# Patient Record
Sex: Female | Born: 1975 | Race: Black or African American | Hispanic: No | Marital: Single | Smoking: Never smoker
Health system: Southern US, Community
[De-identification: ages and names within clinical notes are randomized; demographics above are authoritative.]

## PROBLEM LIST (undated history)

## (undated) DIAGNOSIS — R519 Headache, unspecified: Secondary | ICD-10-CM

## (undated) DIAGNOSIS — K219 Gastro-esophageal reflux disease without esophagitis: Secondary | ICD-10-CM

## (undated) HISTORY — DX: Gastro-esophageal reflux disease without esophagitis: K21.9

## (undated) HISTORY — DX: Headache, unspecified: R51.9

---

## 2019-10-21 ENCOUNTER — Ambulatory Visit: Payer: Self-pay | Attending: Internal Medicine

## 2019-10-21 DIAGNOSIS — Z23 Encounter for immunization: Secondary | ICD-10-CM

## 2019-10-21 NOTE — Progress Notes (Signed)
   Covid-19 Vaccination Clinic  Name:  DREW HERMAN    MRN: [id number] DOB: Mar 08, 1976  10/21/2019  Ms. Risser was observed post Covid-19 immunization for 15 minutes without incident. She was provided with Vaccine Information Sheet and instruction to access the V-Safe system.   Ms. Torrance was instructed to call 911 with any severe reactions post vaccine: Marland Kitchen Difficulty breathing  . Swelling of face and throat  . A fast heartbeat  . A bad rash all over body  . Dizziness and weakness   Immunizations Administered    Name Date Dose VIS Date Route   Pfizer COVID-19 Vaccine 10/21/2019  9:31 AM 0.3 mL 07/23/2019 Intramuscular   Manufacturer: [employer], [employer]   Lot: [id number]   NDC: [address]-7

## 2019-11-15 ENCOUNTER — Ambulatory Visit: Payer: Self-pay | Attending: Internal Medicine

## 2019-11-15 DIAGNOSIS — Z23 Encounter for immunization: Secondary | ICD-10-CM

## 2019-11-15 NOTE — Progress Notes (Signed)
   Covid-19 Vaccination Clinic  Name:  JESIKA MEN    MRN: [id number] DOB: February 14, 1976  11/15/2019  Ms. Wroe was observed post Covid-19 immunization for 15 minutes without incident. She was provided with Vaccine Information Sheet and instruction to access the V-Safe system.   Ms. Clowers was instructed to call 911 with any severe reactions post vaccine: Marland Kitchen Difficulty breathing  . Swelling of face and throat  . A fast heartbeat  . A bad rash all over body  . Dizziness and weakness   Immunizations Administered    Name Date Dose VIS Date Route   Pfizer COVID-19 Vaccine 11/15/2019 12:34 PM 0.3 mL 07/23/2019 Intramuscular   Manufacturer: [employer], [employer]   Lot: [id number]   NDC: [address]-0

## 2020-03-14 ENCOUNTER — Encounter: Payer: Self-pay | Admitting: Neurology

## 2020-03-15 ENCOUNTER — Other Ambulatory Visit: Payer: Self-pay | Admitting: Family Medicine

## 2020-03-15 DIAGNOSIS — N63 Unspecified lump in unspecified breast: Secondary | ICD-10-CM

## 2020-04-05 ENCOUNTER — Other Ambulatory Visit: Payer: Self-pay | Admitting: Family Medicine

## 2020-04-05 ENCOUNTER — Ambulatory Visit
Admission: RE | Admit: 2020-04-05 | Discharge: 2020-04-05 | Disposition: A | Discharge status: Home / Self Care | Payer: 59 | Source: Ambulatory Visit | Attending: Family Medicine | Admitting: Family Medicine

## 2020-04-05 ENCOUNTER — Other Ambulatory Visit: Payer: Self-pay

## 2020-04-05 DIAGNOSIS — N6310 Unspecified lump in the right breast, unspecified quadrant: Secondary | ICD-10-CM

## 2020-04-05 DIAGNOSIS — Z1231 Encounter for screening mammogram for malignant neoplasm of breast: Secondary | ICD-10-CM

## 2020-04-05 DIAGNOSIS — N63 Unspecified lump in unspecified breast: Secondary | ICD-10-CM

## 2020-04-05 IMAGING — US US BREAST*R* LIMITED INC AXILLA
1 series · 6 of 6 positions shown · non-contrast
Comparison: Previous exam(s).

CLINICAL DATA: Patient for short-term follow-up probably benign
right breast mass.

EXAM:
ULTRASOUND OF THE RIGHT BREAST

[Series 1: us breast*right* limited inc axilla · 0.06mm/px · 6 of 6 slices shown]
[im 1/6]
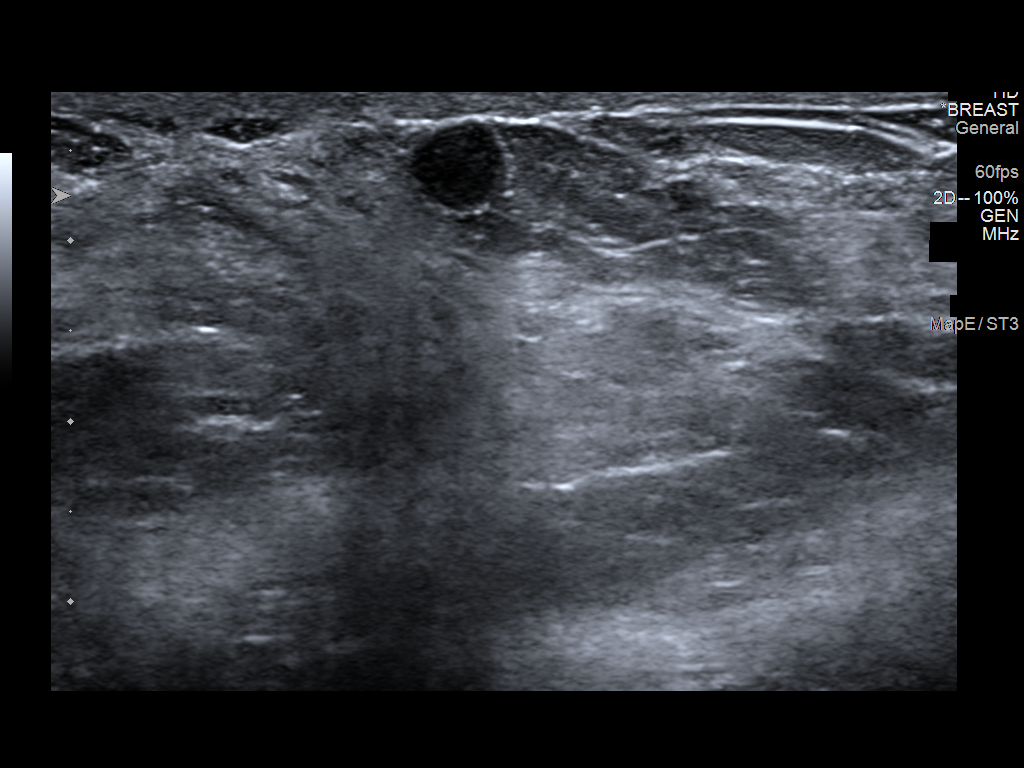
[im 2/6]
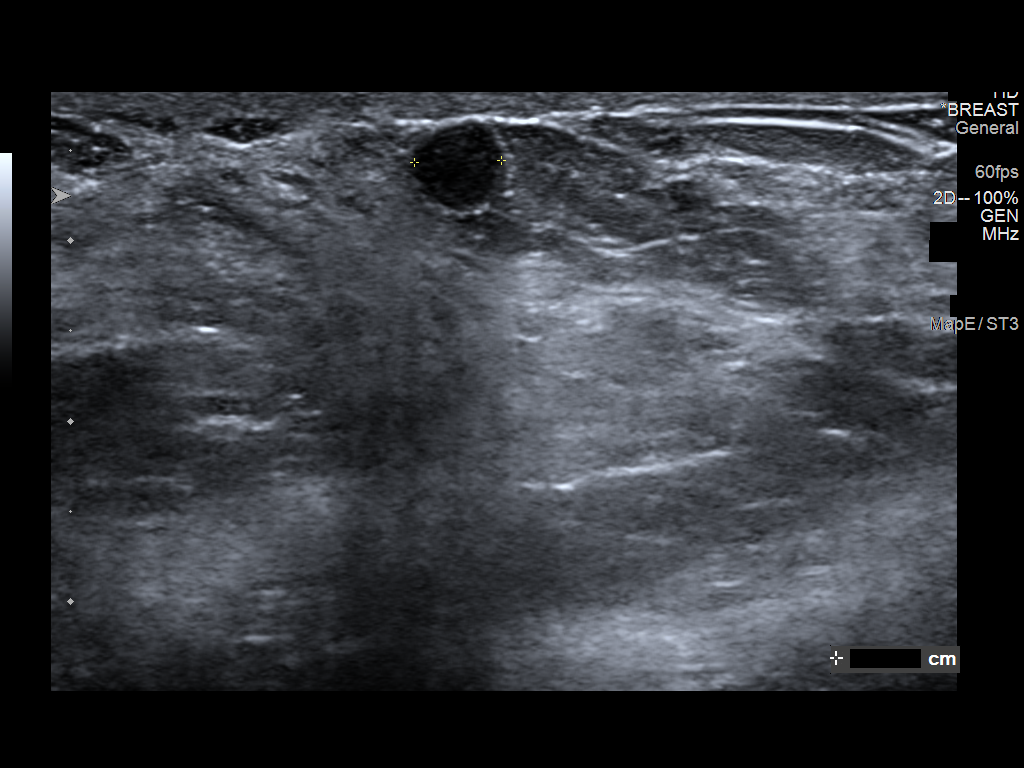
[im 3/6]
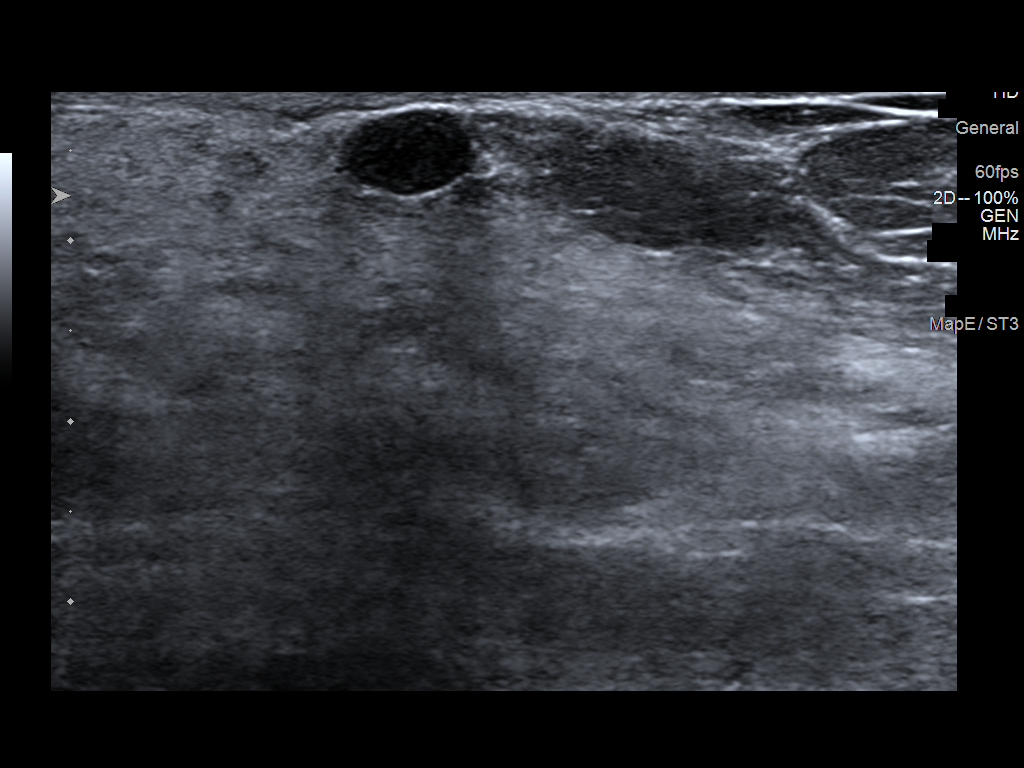
[im 4/6]
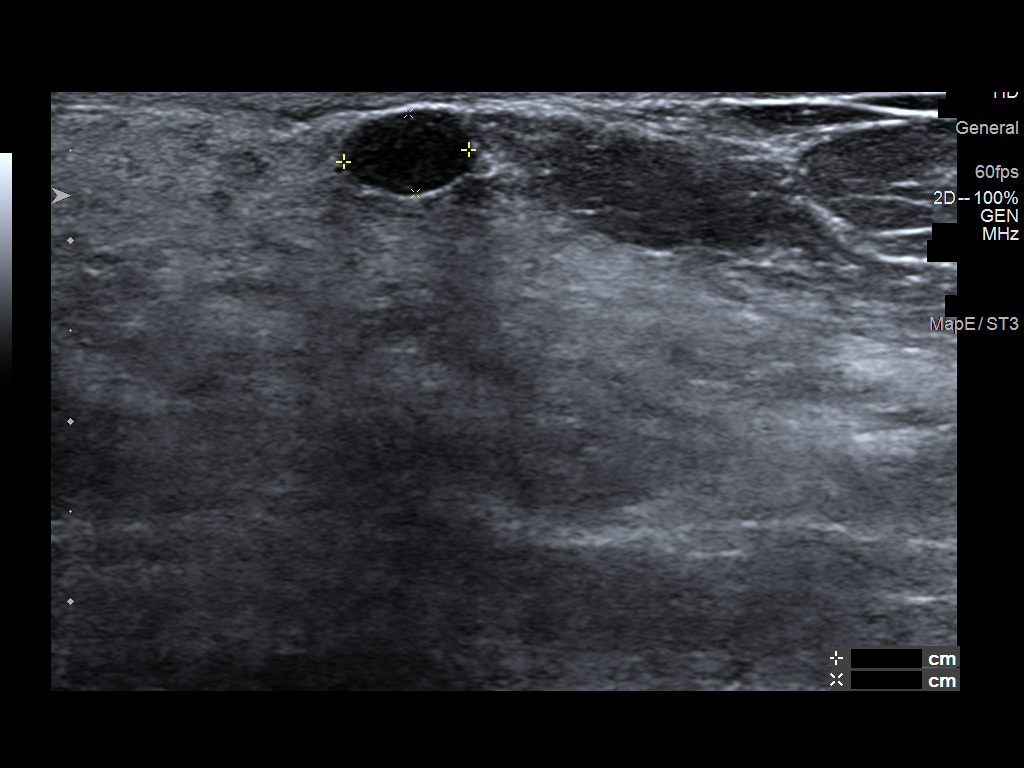
[im 5/6]
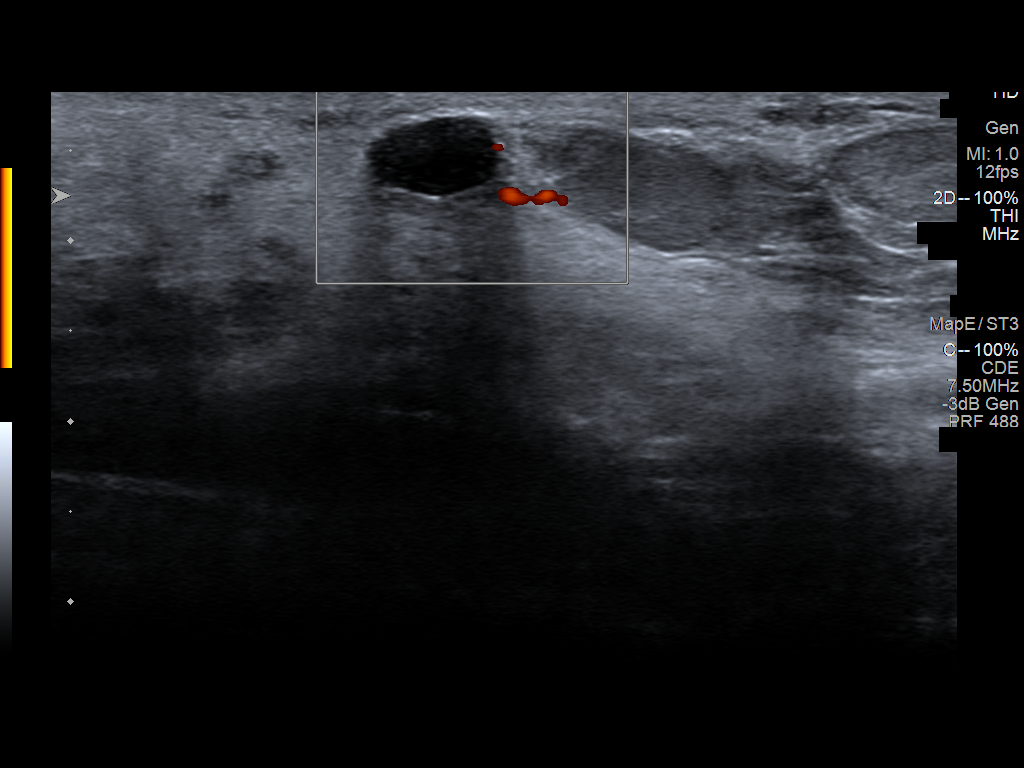
[im 6/6]
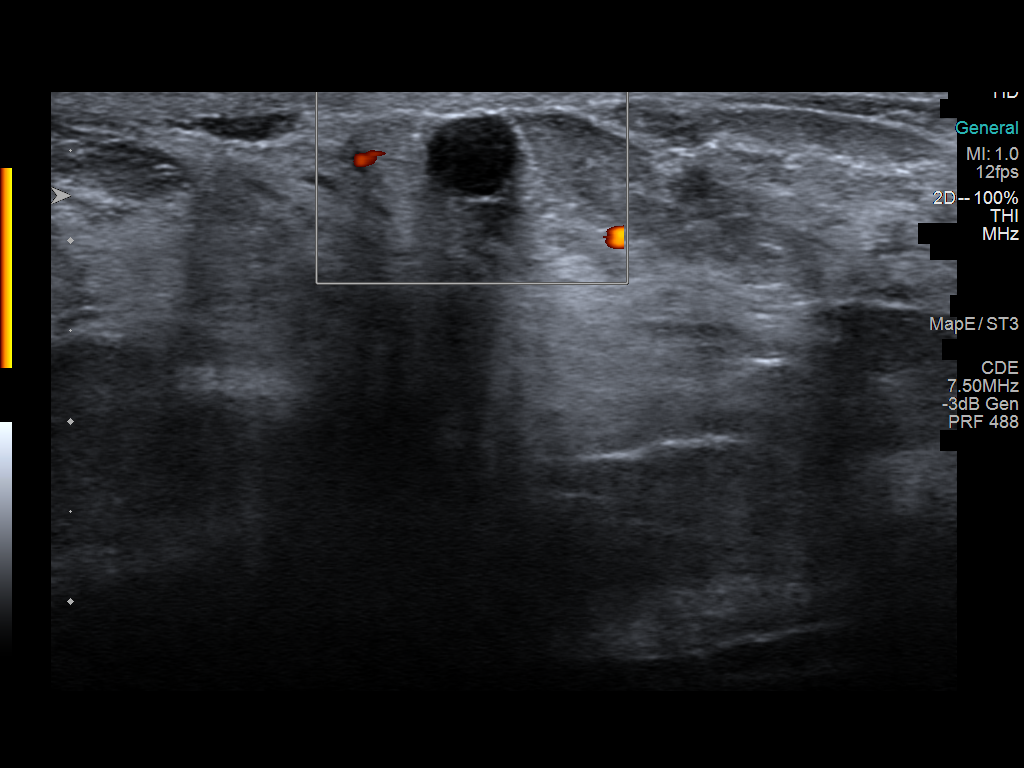

[6 of 6 positions shown; findings below may reference images not displayed]

FINDINGS: Targeted ultrasound is performed, showing a stable 5 x 7 x 4 mm oval
circumscribed hypoechoic mass right breast 6 o'clock position
periareolar location, previously 7 x 5 x 5 mm.
IMPRESSION: Stable probably benign right breast mass.

RECOMMENDATION:
Bilateral diagnostic mammography and right breast ultrasound

I have discussed the findings and recommendations with the patient.
If applicable, a reminder letter will be sent to the patient
regarding the next appointment.

BI-RADS CATEGORY  3: Probably benign.

## 2020-04-24 ENCOUNTER — Other Ambulatory Visit: Payer: Self-pay

## 2020-04-24 ENCOUNTER — Encounter: Payer: Self-pay | Admitting: Neurology

## 2020-04-24 ENCOUNTER — Other Ambulatory Visit: Payer: Self-pay | Admitting: Neurology

## 2020-04-24 ENCOUNTER — Telehealth: Payer: Self-pay | Admitting: Neurology

## 2020-04-24 ENCOUNTER — Ambulatory Visit (INDEPENDENT_AMBULATORY_CARE_PROVIDER_SITE_OTHER): Payer: 59 | Admitting: Neurology

## 2020-04-24 VITALS — BP 134/85 | HR 110 | Ht 65.0 in | Wt 204.4 lb

## 2020-04-24 DIAGNOSIS — G43809 Other migraine, not intractable, without status migrainosus: Secondary | ICD-10-CM

## 2020-04-24 DIAGNOSIS — G43719 Chronic migraine without aura, intractable, without status migrainosus: Secondary | ICD-10-CM

## 2020-04-24 MED ORDER — BACLOFEN 10 MG PO TABS: ORAL_TABLET | ORAL | 5 refills | Status: DC

## 2020-04-24 MED ORDER — VENLAFAXINE HCL ER 37.5 MG PO CP24: ORAL_CAPSULE | ORAL | 0 refills | Status: DC

## 2020-04-24 MED ORDER — PROMETHAZINE HCL 12.5 MG PO TABS: 12.5000 mg | ORAL_TABLET | Freq: Four times a day (QID) | ORAL | 5 refills | Status: DC | PRN

## 2020-04-24 NOTE — Telephone Encounter (Signed)
Prescription sent

## 2020-04-24 NOTE — Progress Notes (Signed)
NEUROLOGY CONSULTATION NOTE  Dawn Hicks MRN: [id number] DOB: [birth year]  Referring provider: Deatra James, MD Primary care provider: Deatra James, MD  Reason for consult:  migraines  HISTORY OF PRESENT ILLNESS: Dawn Hicks is a 44 year old right-handed female who presents for migraines.  History supplemented by referring provider's note.  Onset:  44 years old Location:  Sometimes back of neck, sometimes behind left ear, right temple, sometimes frontal.  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 Quality:  Back of neck is stabbing, throbbing on head Intensity:  severe   Aura:  no Prodrome:  no Postdrome:  no Associated symptoms:  Nausea, vertigo, photophobia, phonophobia.  She denies associated unilateral numbness or weakness. Duration:  1 day to several days Frequency:  Over 15 days a month (5 a month when taking topiramate twice a day) Triggers:  Hunger, certain smell Relieving factors:  Rubbing temples Activity:  aggravates  Current NSAIDS:  none Current analgesics:  none Current triptans:  Sumatriptan 100mg  Current ergotamine:  none Current anti-emetic:  Promethazine 12.5mg  Current muscle relaxants:  none Current anti-anxiolytic:  none Current sleep aide:  none Current Antihypertensive medications:  none Current Antidepressant medications:  none Current Anticonvulsant medications:  topiramate 100mg  once a day but occasionally twice a day (effective but causes brain fog) Current anti-CGRP:  none Current Vitamins/Herbal/Supplements:  B12, zinc, C, D, probiotic, chlorophyl Current Antihistamines/Decongestants:  none Other therapy:  none Hormone/birth control:  none  Past NSAIDS:  Ibuprofen, naproxen Past analgesics:  Excedrin, acetaminophen Past abortive triptans:  none Past abortive ergotamine:  none Past muscle relaxants:  baclofen Past anti-emetic:  promethazine Past antihypertensive medications:  none Past antidepressant medications:  escitalopram Past  anticonvulsant medications:  none Past anti-CGRP:  none Past vitamins/Herbal/Supplements:  none Past antihistamines/decongestants:  none Other past therapies:  Occipital nerve blocks  Caffeine:  Coffee less than once a week.  Sweet tea Diet:  Watches what she eats.  Ginger ale.  Increasing water intake Exercise:  Walks daily.  Cardio 3 times a week. Depression:  yes; Anxiety:  yes Other pain:  none Sleep hygiene:  Trouble staying asleep.  Wakes up at 4 AM Family history of headache:  5 aunts, 3 to 4 cousins  03/14/2020 LABS:  CBC with WBC 4.6, HGB 14.1, HCT 41.5, PLT 272   PAST MEDICAL HISTORY: Past Medical History:  Diagnosis Date  . Headache     PAST SURGICAL HISTORY: History reviewed. No pertinent surgical history.  MEDICATIONS: Outpatient Encounter Medications as of 04/24/2020  Medication Sig  . promethazine (PHENERGAN) 12.5 MG tablet Take 12.5 mg by mouth every 6 (six) hours as needed for nausea or vomiting.  . SUMAtriptan (IMITREX) 100 MG tablet Take 100 mg by mouth every 2 (two) hours as needed for migraine. May repeat in 2 hours if headache persists or recurs.  . topiramate (TOPAMAX) 100 MG tablet Take 100 mg by mouth 2 (two) times daily.  05/14/2020 acyclovir (ZOVIRAX) 400 MG tablet Take 400 mg by mouth 4 (four) times daily.   No facility-administered encounter medications on file as of 04/24/2020.   ALLERGIES: Not on File  FAMILY HISTORY: History reviewed. No pertinent family history.  SOCIAL HISTORY: Social History   Socioeconomic History  . Marital status: Single    Spouse name: Not on file  . Number of children: Not on file  . Years of education: Not on file  . Highest education level: Not on file  Occupational History  . Not on file  Tobacco Use  .  Smoking status: Not on file  Substance and Sexual Activity  . Alcohol use: Not on file  . Drug use: Not on file  . Sexual activity: Not on file  Other Topics Concern  . Not on file  Social History Narrative  .  Not on file   Social Determinants of Health   Financial Resource Strain:   . Difficulty of Paying Living Expenses: Not on file  Food Insecurity:   . Worried About Programme researcher, broadcasting/film/video in the Last Year: Not on file  . Ran Out of Food in the Last Year: Not on file  Transportation Needs:   . Lack of Transportation (Medical): Not on file  . Lack of Transportation (Non-Medical): Not on file  Physical Activity:   . Days of Exercise per Week: Not on file  . Minutes of Exercise per Session: Not on file  Stress:   . Feeling of Stress : Not on file  Social Connections:   . Frequency of Communication with Friends and Family: Not on file  . Frequency of Social Gatherings with Friends and Family: Not on file  . Attends Religious Services: Not on file  . Active Member of Clubs or Organizations: Not on file  . Attends Banker Meetings: Not on file  . Marital Status: Not on file  Intimate Partner Violence:   . Fear of Current or Ex-Partner: Not on file  . Emotionally Abused: Not on file  . Physically Abused: Not on file  . Sexually Abused: Not on file    PHYSICAL EXAM: Blood pressure 134/85, pulse (!) 110, height 5\' 5"  (1.651 m), weight 204 lb 6.4 oz (92.7 kg), SpO2 100 %. General: No acute distress.  Patient appears well-groomed.   Head:  Normocephalic/atraumatic Eyes:  fundi examined but not visualized Neck: supple, no paraspinal tenderness, full range of motion Back: No paraspinal tenderness Heart: regular rate and rhythm Lungs: Clear to auscultation bilaterally. Vascular: No carotid bruits. Neurological Exam: Mental status: alert and oriented to person, place, and time, recent and remote memory intact, fund of knowledge intact, attention and concentration intact, speech fluent and not dysarthric, language intact. Cranial nerves: CN I: not tested CN II: pupils equal, round and reactive to light, visual fields intact CN III, IV, VI:  full range of motion, no nystagmus, no  ptosis CN V: facial sensation intact CN VII: upper and lower face symmetric CN VIII: hearing intact CN IX, X: gag intact, uvula midline CN XI: sternocleidomastoid and trapezius muscles intact CN XII: tongue midline Bulk & Tone: normal, no fasciculations. Motor:  5/5 throughout  Sensation:  Pinprick and vibration sensation intact.   Deep Tendon Reflexes:  2+ throughout, toes downgoing.  Finger to nose testing:  Without dysmetria. Heel to shin:  Without dysmetria.  * Gait:  Normal station and stride. Romberg negative.  IMPRESSION: 1.  Chronic migraine without aura, without status migrainosus, not intractable 2.  Vestibular migraine with episodes of vertigo, nausea and car sickness  PLAN: 1.  For preventative management, stop topiramate and start venlafaxine XR 37.5mg  daily for one week, then 75mg  daily 2.  For abortive therapy, stop sumatriptan tablet and will try Tosymra. 3. Promethazine for nausea 4. Baclofen 10mg  for neck spasms 5.  Limit use of pain relievers to no more than 2 days out of week to prevent risk of rebound or medication-overuse headache. 6.  Keep headache diary 7.  Exercise, hydration, caffeine cessation, sleep hygiene, monitor for and avoid triggers 8.  Consider:  magnesium citrate 400mg  daily, riboflavin 400mg  daily, and coenzyme Q10 100mg  three times daily 9. Always keep in mind that currently taking a hormone or birth control may be a possible trigger or aggravating factor for migraine. 10. Follow up 6 months   Thank you for allowing me to take part in the care of this patient.  , DO  CC: , MD

## 2020-04-24 NOTE — Patient Instructions (Signed)
  1. Stop topiramate.  Start venlafaxine XR 37.5mg  pill.  Take 1 pill every morning for one week, then increase to 2 pills every morning.  If headaches not improved in 7 weeks, contact me and we can increase dose. 2. Take Tosymra nasal spray in one nostril at earliest onset of headache.  May repeat dose once in 1 hour if needed.  Maximum 2 sprays in 24 hours. 3. May use baclofen as needed for neck tightness.  Caution for drowsiness 4. Promethazine for nausea. 5. Limit use of pain relievers to no more than 2 days out of the week.  These medications include acetaminophen, NSAIDs (ibuprofen/Advil/Motrin, naproxen/Aleve, triptans (Imitrex/sumatriptan), Excedrin, and narcotics.  This will help reduce risk of rebound headaches. 6. Be aware of common food triggers:  - Caffeine:  coffee, black tea, cola, Mt. Dew  - Chocolate  - Dairy:  aged cheeses (brie, blue, cheddar, gouda, [place], provolone, [place], Swiss, etc), chocolate milk, buttermilk, sour cream, limit eggs and yogurt  - Nuts, peanut butter  - Alcohol  - Cereals/grains:  FRESH breads (fresh bagels, sourdough, doughnuts), yeast productions  - Processed/canned/aged/cured meats (pre-packaged deli meats, hotdogs)  - MSG/glutamate:  soy sauce, flavor enhancer, pickled/preserved/marinated foods  - Sweeteners:  aspartame (Equal, Nutrasweet).  Sugar and Splenda are okay  - Vegetables:  legumes (lima beans, lentils, snow peas, fava beans, pinto peans, peas, garbanzo beans), sauerkraut, onions, olives, pickles  - Fruit:  avocados, bananas, citrus fruit (orange, lemon, grapefruit), mango  - Other:  Frozen meals, macaroni and cheese 7. Routine exercise 8. Stay adequately hydrated (aim for 64 oz water daily) 9. Keep headache diary 10. Maintain proper stress management 11. Maintain proper sleep hygiene 12. Do not skip meals 13. Consider supplements:  magnesium citrate 400mg  daily, riboflavin 400mg  daily, coenzyme Q10 100mg  three times daily.

## 2020-04-24 NOTE — Telephone Encounter (Signed)
Patient wanted to clarify that she doesn't have any promethazine left. She uses [facility] on [address]

## 2020-05-31 ENCOUNTER — Other Ambulatory Visit: Payer: Self-pay | Admitting: Neurology

## 2020-06-02 ENCOUNTER — Ambulatory Visit: Payer: Self-pay | Admitting: Neurology

## 2020-06-20 LAB — HM COLONOSCOPY

## 2020-07-08 ENCOUNTER — Other Ambulatory Visit: Payer: Self-pay | Admitting: Neurology

## 2020-07-10 ENCOUNTER — Ambulatory Visit: Payer: 59 | Attending: Internal Medicine

## 2020-07-10 DIAGNOSIS — Z23 Encounter for immunization: Secondary | ICD-10-CM

## 2020-07-10 NOTE — Progress Notes (Signed)
   Covid-19 Vaccination Clinic  Name:  Dawn Hicks    MRN: [id number] DOB: 11/02/1975  07/10/2020  Dawn Hicks was observed post Covid-19 immunization for 15 minutes without incident. She was provided with Vaccine Information Sheet and instruction to access the V-Safe system.   Dawn Hicks was instructed to call 911 with any severe reactions post vaccine: Marland Kitchen Difficulty breathing  . Swelling of face and throat  . A fast heartbeat  . A bad rash all over body  . Dizziness and weakness   Immunizations Administered    Name Date Dose VIS Date Route   Pfizer COVID-19 Vaccine 07/10/2020  3:01 PM 0.3 mL 05/31/2020 Intramuscular   Manufacturer: [employer], [employer]   Lot: [id number]   NDC: [address]-0

## 2020-10-09 ENCOUNTER — Ambulatory Visit
Admission: RE | Admit: 2020-10-09 | Discharge: 2020-10-09 | Disposition: A | Discharge status: Home / Self Care | Payer: 59 | Source: Ambulatory Visit | Attending: Family Medicine | Admitting: Family Medicine

## 2020-10-09 ENCOUNTER — Other Ambulatory Visit: Payer: 59

## 2020-10-09 ENCOUNTER — Other Ambulatory Visit: Payer: Self-pay

## 2020-10-09 ENCOUNTER — Other Ambulatory Visit: Payer: Self-pay | Admitting: Neurology

## 2020-10-09 DIAGNOSIS — N6310 Unspecified lump in the right breast, unspecified quadrant: Secondary | ICD-10-CM

## 2020-10-09 IMAGING — US US BREAST*R* LIMITED INC AXILLA
1 series · 7 of 7 positions shown · non-contrast
Comparison: Previous exam(s).

CLINICAL DATA: Follow-up probably benign mass in the 6 o'clock
periareolar right breast.

EXAM:
DIGITAL DIAGNOSTIC BILATERAL MAMMOGRAM WITH TOMOSYNTHESIS AND CAD;
ULTRASOUND RIGHT BREAST LIMITED
TECHNIQUE: Bilateral digital diagnostic mammography and breast tomosynthesis
was performed. The images were evaluated with computer-aided
detection.; Targeted ultrasound examination of the right breast was
performed

[Series 1: us breast*right* limited inc axilla · 0.06mm/px · 7 of 7 slices shown]
[im 1/7]
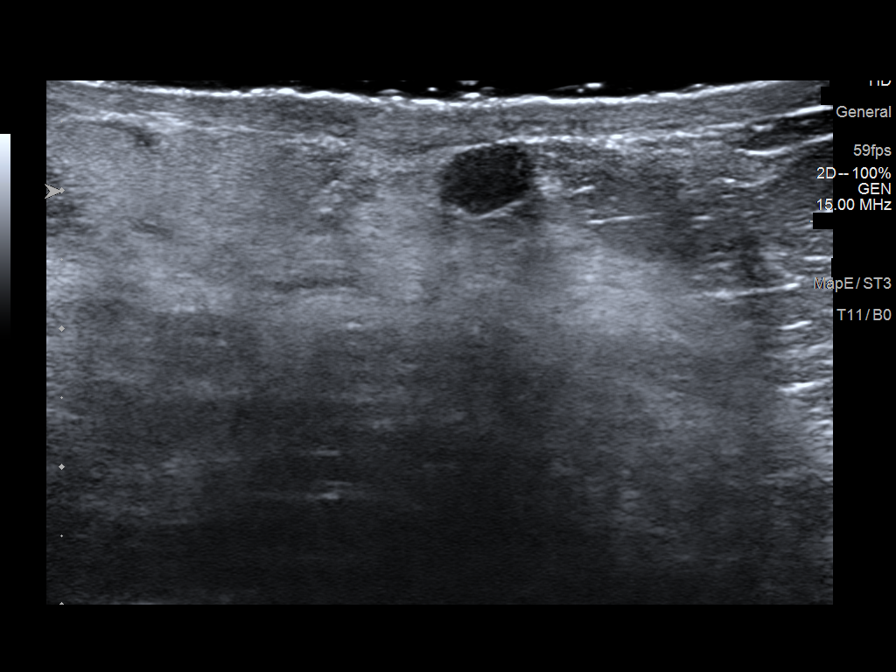
[im 2/7]
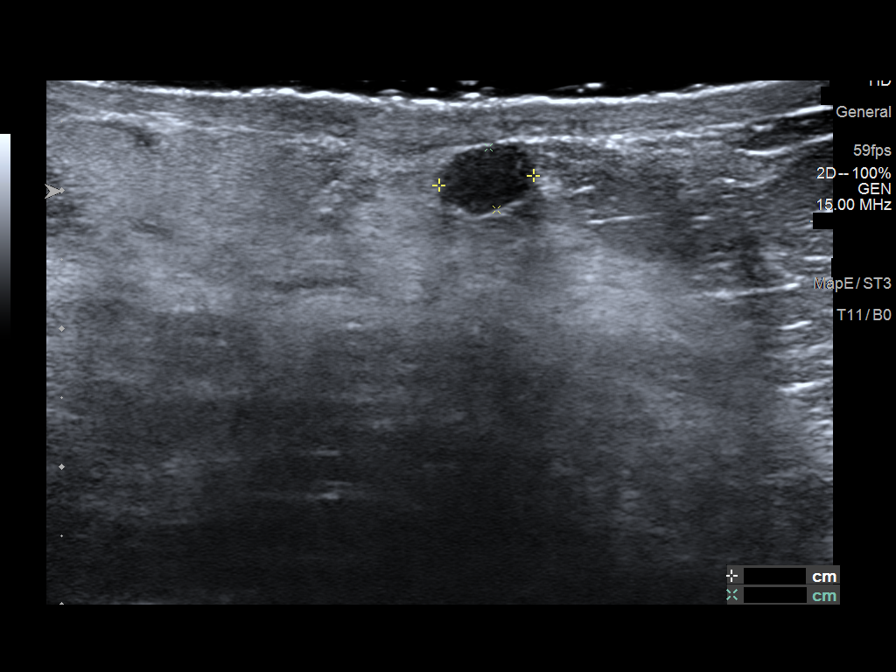
[im 3/7]
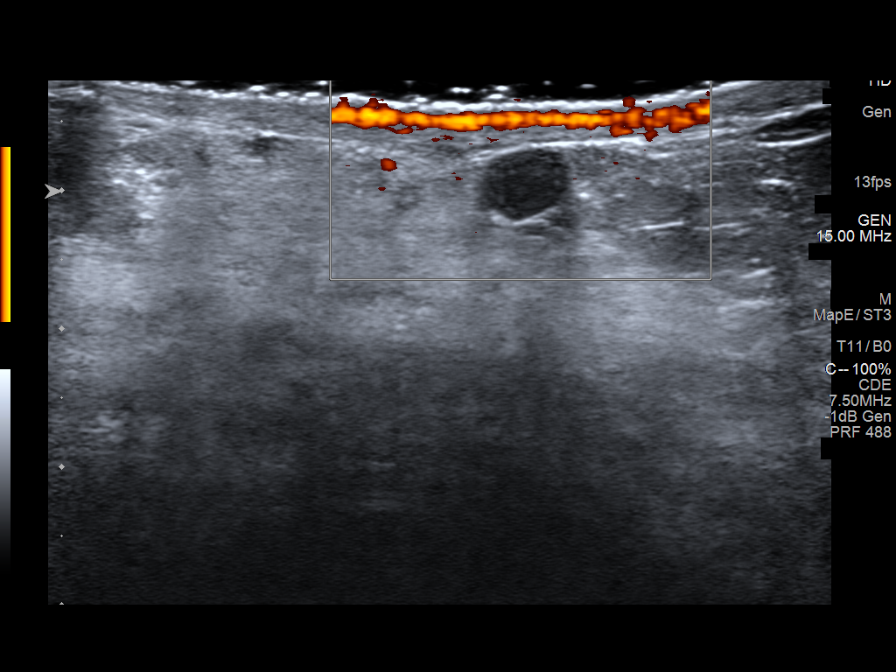
[im 4/7]
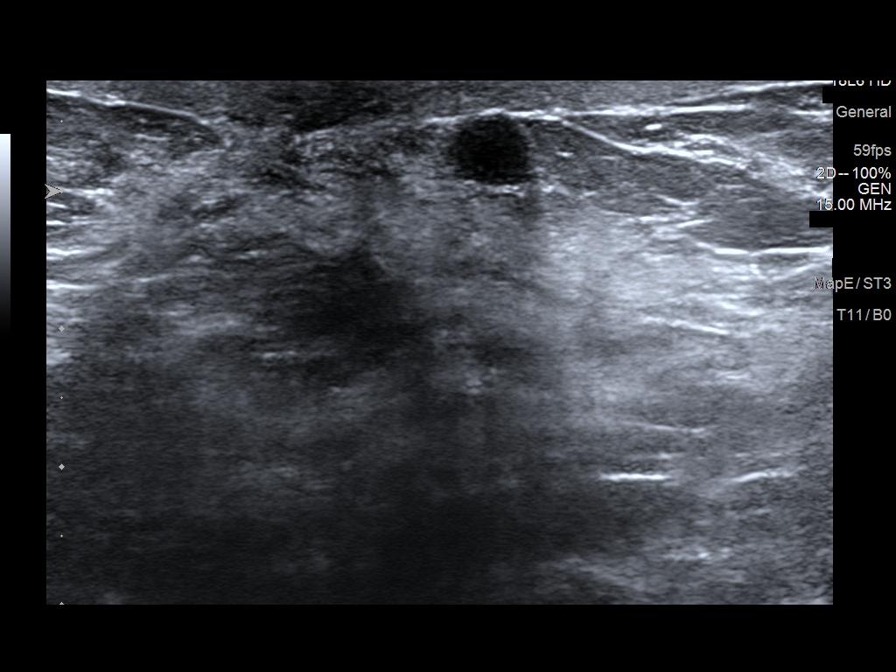
[im 5/7]
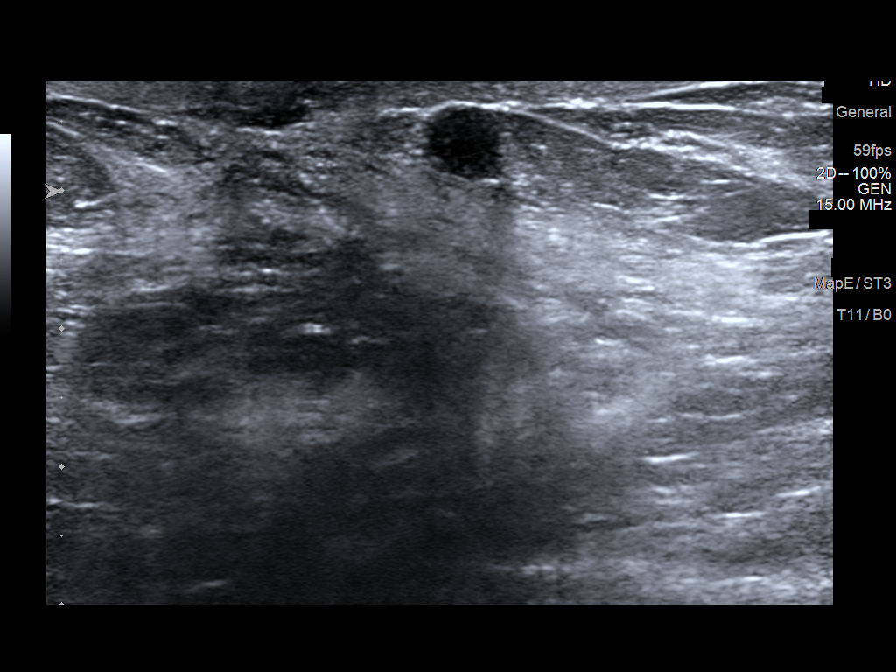
[im 6/7]
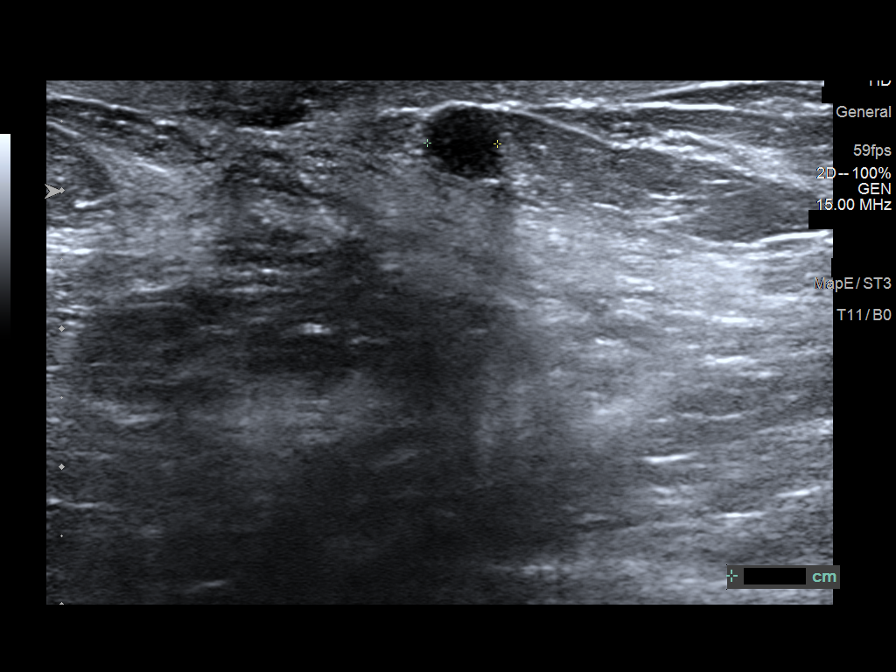
[im 7/7]
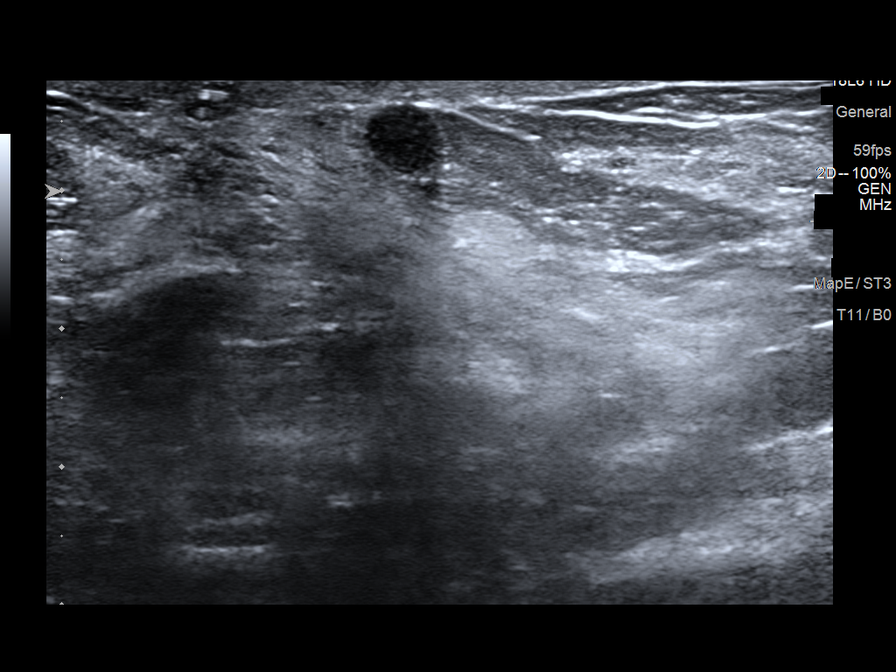

[7 of 7 positions shown; findings below may reference images not displayed]

ACR Breast Density Category c: The breast tissue is heterogeneously
dense, which may obscure small masses.
FINDINGS: A small, oval, circumscribed mass in the medial periareolar right
breast has not changed significantly. No interval mammographic
findings suspicious for malignancy in either breast.

Targeted ultrasound is performed, showing a 7 x 5 x 5 mm oval,
horizontally oriented, circumscribed hypoechoic mass with low-level
internal echoes is demonstrated in the 6 o'clock periareolar region
of the breast. This is unchanged since [DATE], measuring 7 x 5 x
4 mm at that time. This measured 7 x 5 x 5 mm on [DATE]. No
medial periareolar masses are seen.
IMPRESSION: 1. Stable probably benign mass in the 6 o'clock periareolar right
breast, most likely representing a mildly complicated cyst.
2. No evidence of malignancy elsewhere in either breast.

RECOMMENDATION:
Bilateral diagnostic mammogram and right breast ultrasound in 1 year
to complete 2 years of follow-up of the probably benign mass in the
6 o'clock periareolar right breast.

I have discussed the findings and recommendations with the patient.
If applicable, a reminder letter will be sent to the patient
regarding the next appointment.

BI-RADS CATEGORY  3: Probably benign.

## 2020-10-09 IMAGING — MG DIGITAL DIAGNOSTIC BILAT W/ TOMO W/ CAD
6 of 12 series · 6 of 36 positions shown · non-contrast
Comparison: Previous exam(s).

CLINICAL DATA: Follow-up probably benign mass in the 6 o'clock
periareolar right breast.

EXAM:
DIGITAL DIAGNOSTIC BILATERAL MAMMOGRAM WITH TOMOSYNTHESIS AND CAD;
ULTRASOUND RIGHT BREAST LIMITED
TECHNIQUE: Bilateral digital diagnostic mammography and breast tomosynthesis
was performed. The images were evaluated with computer-aided
detection.; Targeted ultrasound examination of the right breast was
performed

[L ML synth-2D]
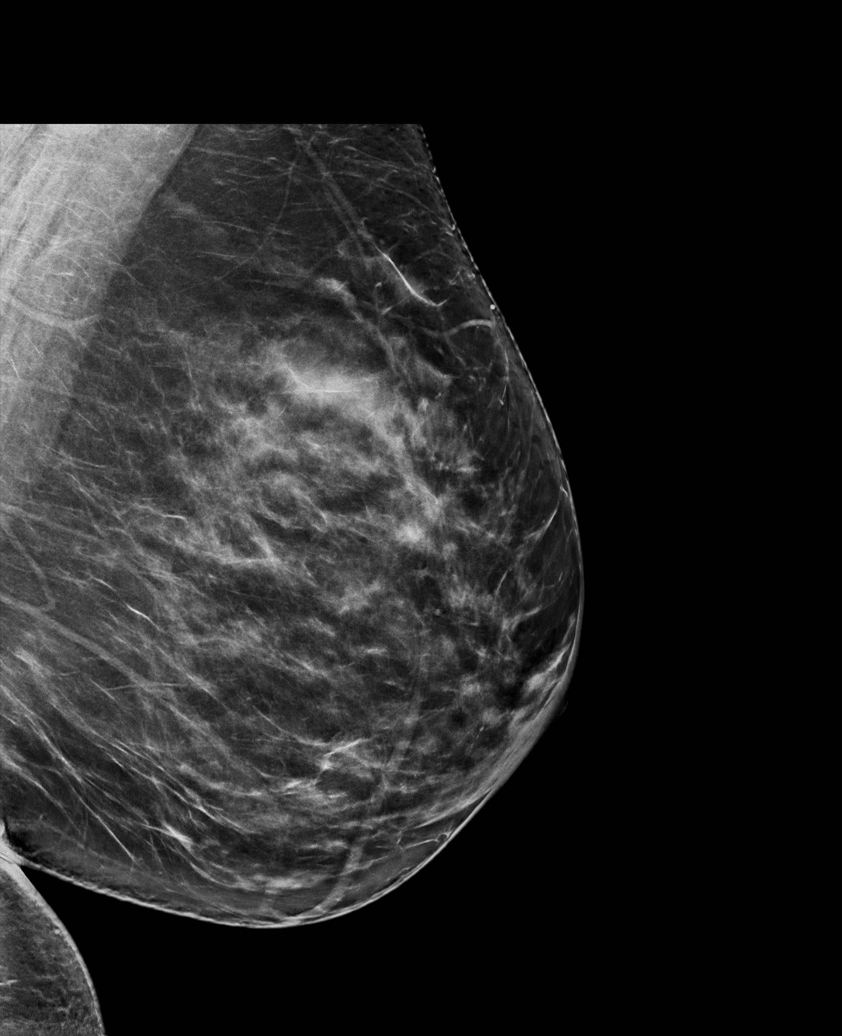

[L MLO synth-2D (1 of 2)]
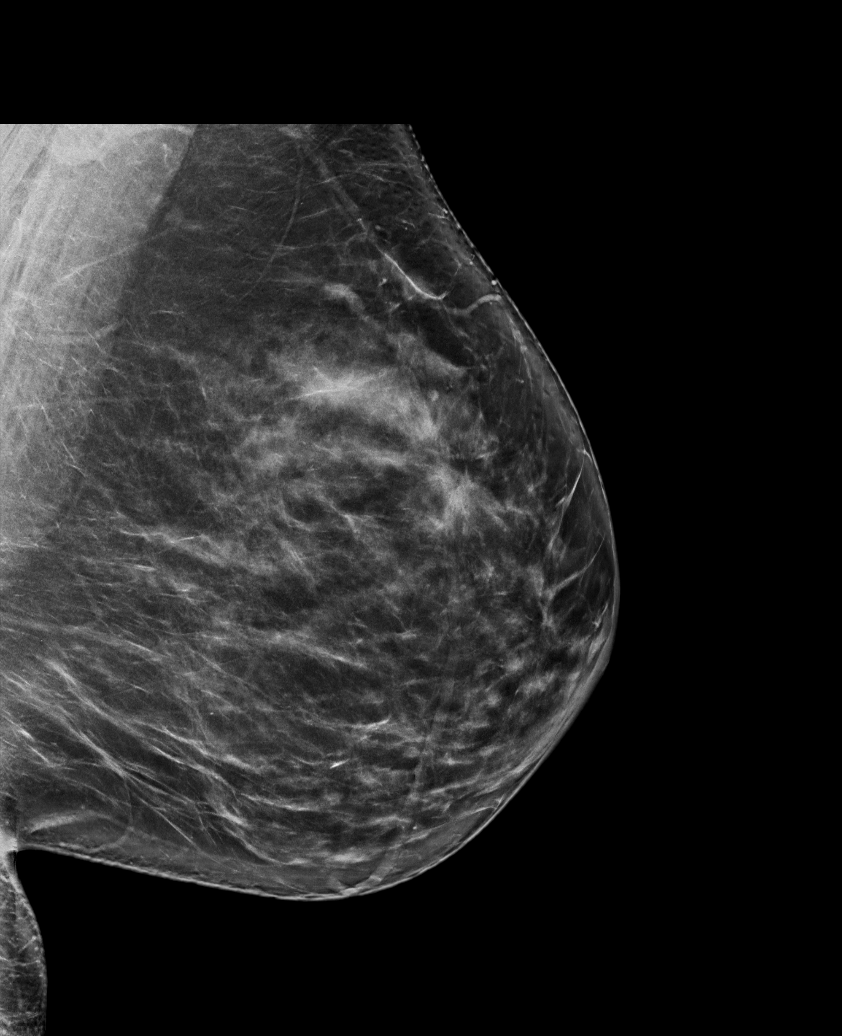

[R MLO synth-2D]
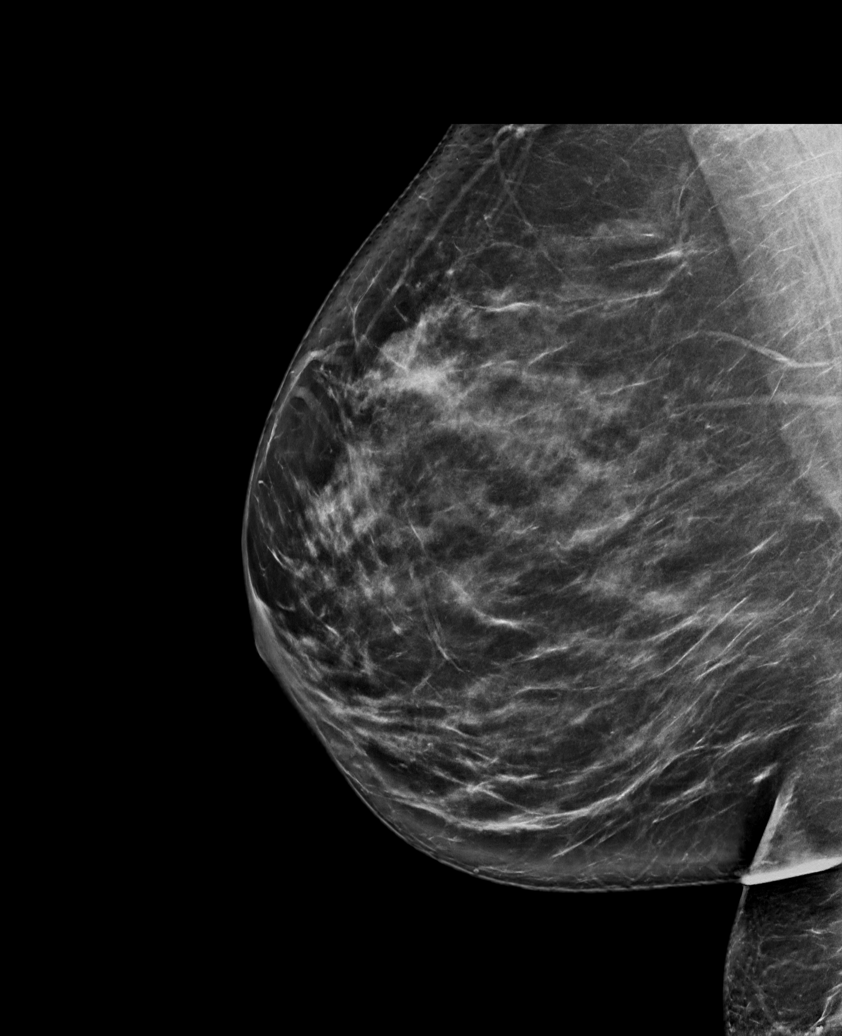

[L MLO synth-2D (2 of 2)]
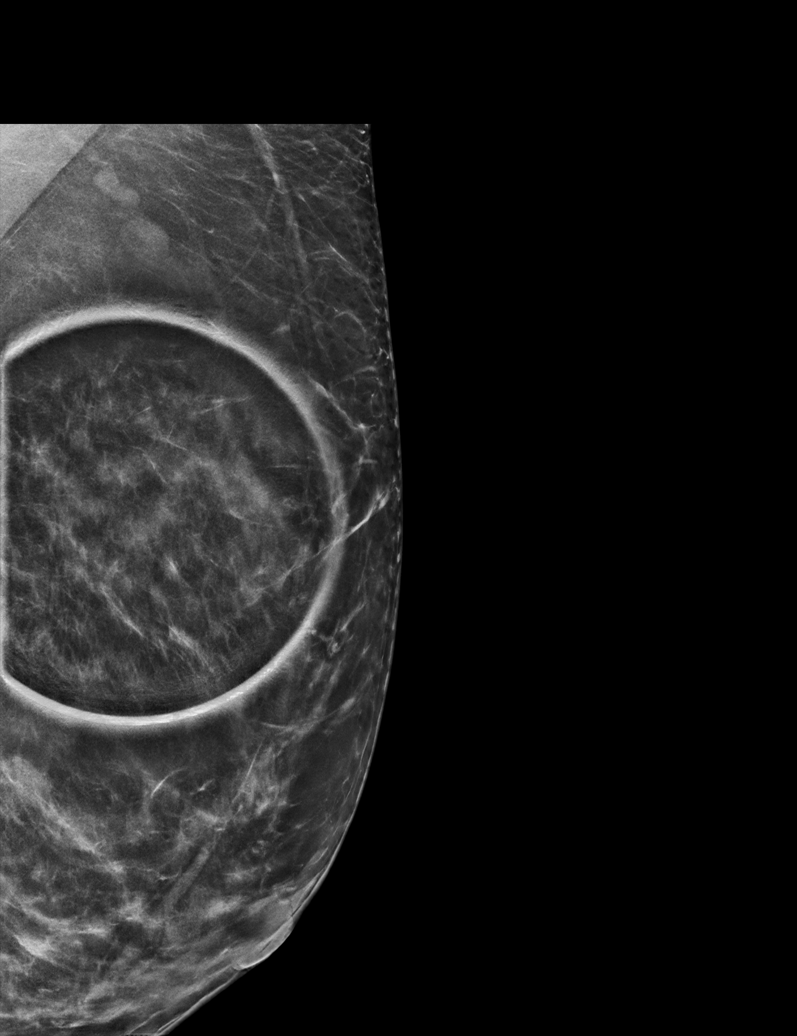

[R CC synth-2D]
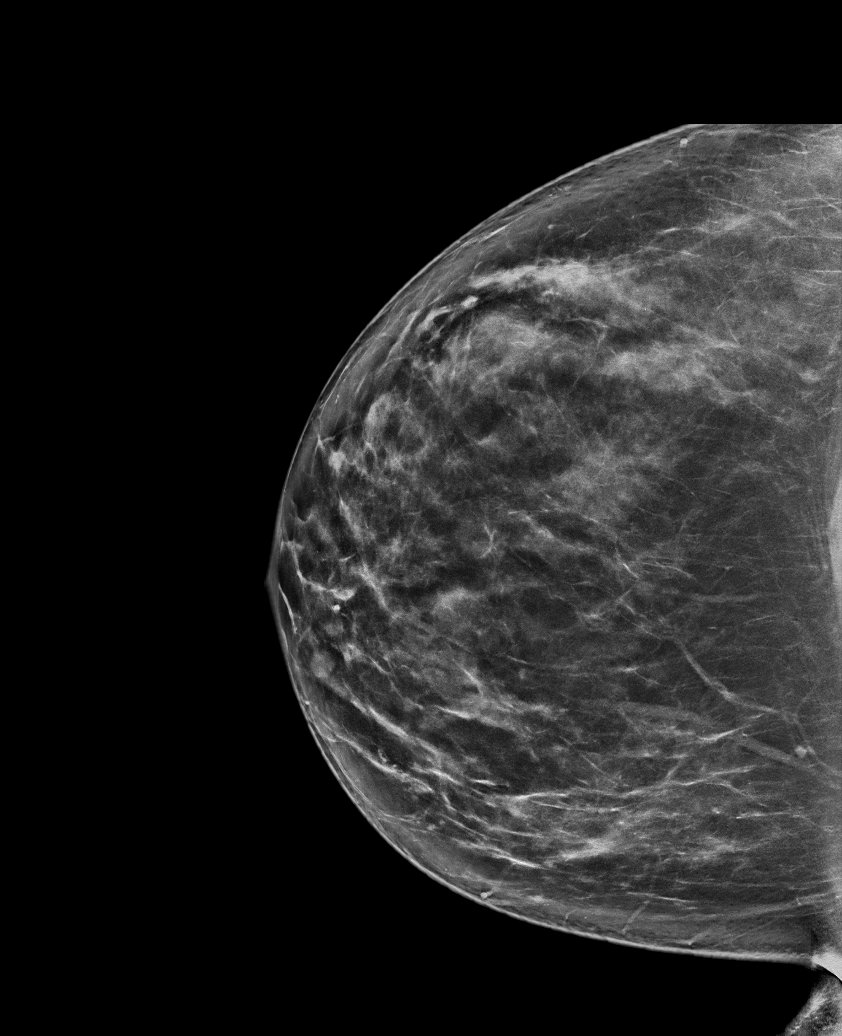

[L CC synth-2D]
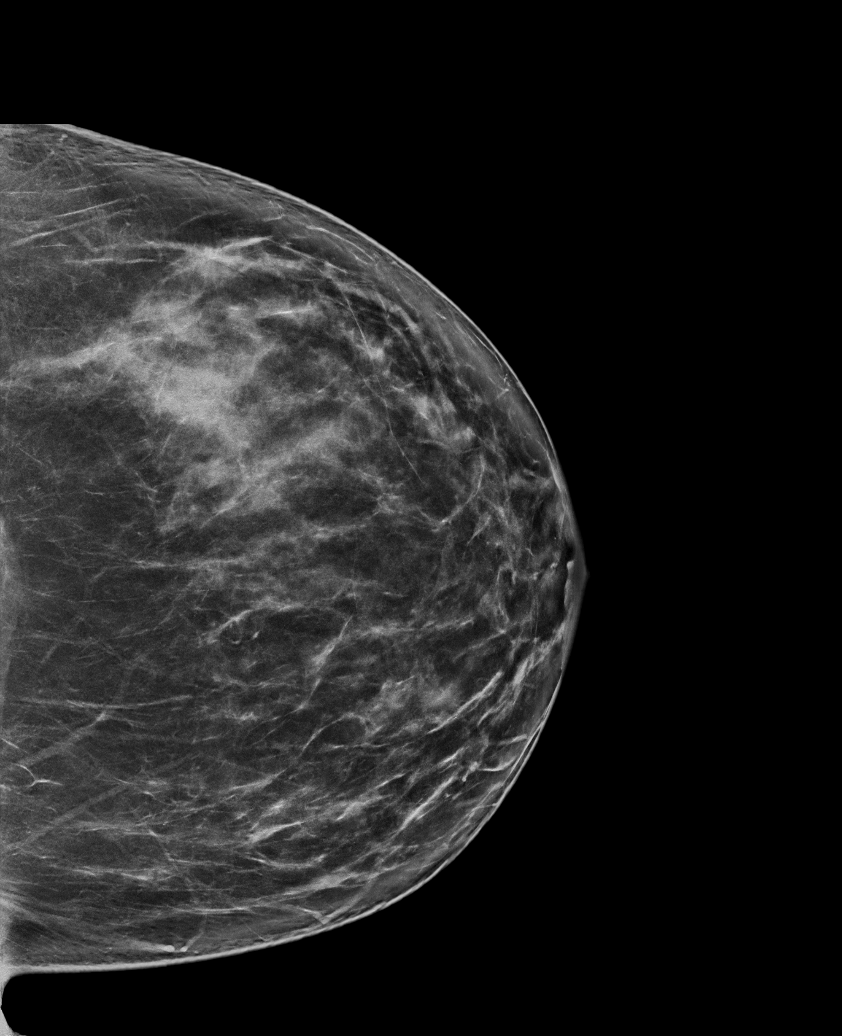

[6 of 36 positions shown; findings below may reference images not displayed]

ACR Breast Density Category c: The breast tissue is heterogeneously
dense, which may obscure small masses.
FINDINGS: A small, oval, circumscribed mass in the medial periareolar right
breast has not changed significantly. No interval mammographic
findings suspicious for malignancy in either breast.

Targeted ultrasound is performed, showing a 7 x 5 x 5 mm oval,
horizontally oriented, circumscribed hypoechoic mass with low-level
internal echoes is demonstrated in the 6 o'clock periareolar region
of the breast. This is unchanged since [DATE], measuring 7 x 5 x
4 mm at that time. This measured 7 x 5 x 5 mm on [DATE]. No
medial periareolar masses are seen.
IMPRESSION: 1. Stable probably benign mass in the 6 o'clock periareolar right
breast, most likely representing a mildly complicated cyst.
2. No evidence of malignancy elsewhere in either breast.

RECOMMENDATION:
Bilateral diagnostic mammogram and right breast ultrasound in 1 year
to complete 2 years of follow-up of the probably benign mass in the
6 o'clock periareolar right breast.

I have discussed the findings and recommendations with the patient.
If applicable, a reminder letter will be sent to the patient
regarding the next appointment.

BI-RADS CATEGORY  3: Probably benign.

## 2020-10-09 NOTE — Telephone Encounter (Signed)
Enough given to until appt

## 2020-10-20 NOTE — Progress Notes (Signed)
NEUROLOGY FOLLOW UP OFFICE NOTE  Yanely Mast [id number]  Assessment/Plan:   Migraine without aura, without status migrainosus, not intractable  1.  Migraine prevention:  Venlafaxine XR 75mg  daily 2.  Migraine rescue:  Ibuprofen 800mg .  May try Tosymra if needed. 3.  Nausea:  Promethazine 12.5mg  4.  Neck spasms:  Baclofen 10mg   5.  Limit use of pain relievers to no more than 2 days out of week to prevent risk of rebound or medication-overuse headache. 6.  Keep headache diary 7.  Follow up 6 months.  Subjective:  Dawn Hicks is a 45 year old right-handed female who follows up for migraines.  UPDATE: In September, switched from topiramate to venlafaxine.  Helps but if she misses a dose, she feels restless and woozy.  Dizziness improved. Intensity:  severe Duration:  1 to 2 hours with ibuprofen  Frequency:  5 days in 30 days, usually around menses. Frequency of abortive medication: 5 days a month  Rescue therapy:  Ibuprofen 800mg  (effective).  Has not yet tried Tosymra Current NSAIDS:  ibuprofen 800mg  Current analgesics:  none Current triptans:  Tosymra NS (has not yet needed to take it) Current ergotamine:  none Current anti-emetic:  Promethazine 12.5mg  Current muscle relaxants:  baclofen 10mg  (neck spasms/posterior headache) Current anti-anxiolytic:  none Current sleep aide:  none Current Antihypertensive medications:  none Current Antidepressant medications:  venlafaxine XR 75mg  daily Current Anticonvulsant medications:  none Current anti-CGRP:  none Current Vitamins/Herbal/Supplements:  B12, zinc, C, D, probiotic, chlorophyl Current Antihistamines/Decongestants:  Flonase Other therapy:  none Hormone/birth control:  none  Caffeine:  Coffee less than once a week.  Sweet tea Diet:  Watches what she eats.  Ginger ale.  Increasing water intake Exercise:  Walks daily.  Cardio 3 times a week. Depression:  yes; Anxiety:  yes Other pain:  none Sleep hygiene:   Trouble staying asleep.  Wakes up at 4 AM  HISTORY: Onset:  45 years old Location:  Sometimes back of neck, sometimes behind left ear, right temple, sometimes frontal.  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 Quality:  Back of neck is stabbing, throbbing on head Initial Intensity:  severe   Aura:  no Prodrome:  no Postdrome:  no Associated symptoms:  Nausea, vertigo, photophobia, phonophobia.  She denies associated unilateral numbness or weakness. Initial Duration:  1 day to several days Initial Frequency:  Over 15 days a month (5 a month when taking topiramate twice a day) Triggers:  Hunger, certain smell Relieving factors:  Rubbing temples Activity:  aggravates   Past NSAIDS:  Ibuprofen, naproxen Past analgesics:  Excedrin, acetaminophen Past abortive triptans:  sumatriptan 100mg  Past abortive ergotamine:  none Past muscle relaxants:  baclofen Past anti-emetic:  promethazine Past antihypertensive medications:  none Past antidepressant medications:  escitalopram Past anticonvulsant medications:  topiramate Past anti-CGRP:  none Past vitamins/Herbal/Supplements:  none Past antihistamines/decongestants:  none Other past therapies:  Occipital nerve blocks   Family history of headache:  5 aunts, 3 to 4 cousins  PAST MEDICAL HISTORY: Past Medical History:  Diagnosis Date  . Headache     MEDICATIONS: Current Outpatient Medications on File Prior to Visit  Medication Sig Dispense Refill  . acyclovir (ZOVIRAX) 400 MG tablet Take 400 mg by mouth 4 (four) times daily.    . baclofen (LIORESAL) 10 MG tablet Take 1 tablet three times daily as needed.  Caution for drowsiness. 30 each 5  . promethazine (PHENERGAN) 12.5 MG tablet Take 1 tablet (12.5 mg total) by mouth every 6 (  six) hours as needed for nausea or vomiting. 30 tablet 5  . venlafaxine XR (EFFEXOR-XR) 37.5 MG 24 hr capsule TAKE 1 CAPSULE BY MOUTH EVERY MORNING FOR 1 WEEK THEN TAKE 2 CAPSULES BY MOUTH EVERY MORNING 28 capsule 0   No  current facility-administered medications on file prior to visit.    ALLERGIES: Not on File  FAMILY HISTORY: Family History  Problem Relation Age of Onset  . Breast cancer Maternal Aunt       Objective:  Blood pressure 109/77, pulse 95, resp. rate 20, height 5\' 5"  (1.651 m), weight 198 lb (89.8 kg), SpO2 99 %. General: No acute distress.  Patient appears well-groomed.     , DO  CC:  Shon Millet, MD

## 2020-10-23 ENCOUNTER — Encounter: Payer: Self-pay | Admitting: Neurology

## 2020-10-23 ENCOUNTER — Other Ambulatory Visit: Payer: Self-pay

## 2020-10-23 ENCOUNTER — Ambulatory Visit (INDEPENDENT_AMBULATORY_CARE_PROVIDER_SITE_OTHER): Payer: 59 | Admitting: Neurology

## 2020-10-23 VITALS — BP 109/77 | HR 95 | Resp 20 | Ht 65.0 in | Wt 198.0 lb

## 2020-10-23 DIAGNOSIS — G43809 Other migraine, not intractable, without status migrainosus: Secondary | ICD-10-CM

## 2020-10-23 DIAGNOSIS — G43009 Migraine without aura, not intractable, without status migrainosus: Secondary | ICD-10-CM | POA: Diagnosis not present

## 2020-10-23 MED ORDER — BACLOFEN 10 MG PO TABS
ORAL_TABLET | ORAL | 5 refills | Status: DC
Start: 2020-10-23 — End: 2022-04-23

## 2020-10-23 MED ORDER — PROMETHAZINE HCL 12.5 MG PO TABS: 12.5000 mg | ORAL_TABLET | Freq: Four times a day (QID) | ORAL | 5 refills | Status: DC | PRN

## 2020-10-23 MED ORDER — VENLAFAXINE HCL ER 75 MG PO CP24: 75.0000 mg | ORAL_CAPSULE | Freq: Every day | ORAL | 5 refills | Status: DC

## 2020-10-23 NOTE — Patient Instructions (Signed)
Refilled Venlafaxine XR 75mg  daily Baclofen Promethazine  Limit use of pain relievers to no more than 2 days out of week to prevent risk of rebound or medication-overuse headache. Keep headache diary  Use ibuprofen 800mg  or Tosymra as needed

## 2020-10-30 ENCOUNTER — Telehealth: Payer: Self-pay | Admitting: Neurology

## 2020-10-30 NOTE — Telephone Encounter (Signed)
That is pretty common to have scalp tenderness with or after migraine.  I would just keep an eye on it.

## 2020-10-30 NOTE — Telephone Encounter (Signed)
Patient called in stating yesterday she had an "excruciating" headache and now she feels a tender "soft" spot on her head. She is concerned.

## 2020-10-30 NOTE — Telephone Encounter (Signed)
The headache is gone, but soft spot tenderness. Slight headache, just cold packs, no fever, chills. No falls. States more of size of pea.

## 2020-10-30 NOTE — Telephone Encounter (Signed)
Patient advised.

## 2021-01-03 ENCOUNTER — Other Ambulatory Visit: Payer: Self-pay

## 2021-01-03 ENCOUNTER — Ambulatory Visit (INDEPENDENT_AMBULATORY_CARE_PROVIDER_SITE_OTHER): Payer: 59

## 2021-01-03 ENCOUNTER — Encounter: Payer: Self-pay | Admitting: Orthopaedic Surgery

## 2021-01-03 ENCOUNTER — Ambulatory Visit (INDEPENDENT_AMBULATORY_CARE_PROVIDER_SITE_OTHER): Payer: 59 | Admitting: Orthopaedic Surgery

## 2021-01-03 DIAGNOSIS — M545 Low back pain, unspecified: Secondary | ICD-10-CM

## 2021-01-03 DIAGNOSIS — M25552 Pain in left hip: Secondary | ICD-10-CM

## 2021-01-03 DIAGNOSIS — M542 Cervicalgia: Secondary | ICD-10-CM

## 2021-01-03 DIAGNOSIS — M25551 Pain in right hip: Secondary | ICD-10-CM

## 2021-01-03 MED ORDER — PREDNISONE 10 MG (21) PO TBPK: ORAL_TABLET | ORAL | 0 refills | Status: DC
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
## 2021-01-04 ENCOUNTER — Encounter: Payer: Self-pay | Admitting: Physical Therapy

## 2021-01-04 ENCOUNTER — Ambulatory Visit: Payer: 59 | Attending: Physician Assistant | Admitting: Physical Therapy

## 2021-01-04 DIAGNOSIS — G8929 Other chronic pain: Secondary | ICD-10-CM | POA: Insufficient documentation

## 2021-01-04 DIAGNOSIS — M6281 Muscle weakness (generalized): Secondary | ICD-10-CM | POA: Insufficient documentation

## 2021-01-04 DIAGNOSIS — M545 Low back pain, unspecified: Secondary | ICD-10-CM | POA: Insufficient documentation

## 2021-01-04 DIAGNOSIS — R252 Cramp and spasm: Secondary | ICD-10-CM | POA: Insufficient documentation
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
## 2021-01-04 NOTE — Patient Instructions (Signed)
Access Code: [email or web address] URL: [email or web address] Date: 01/04/2021 Prepared by: Lysle Rubens  Exercises Seated Piriformis Stretch with Trunk Bend - 1 x daily - 7 x weekly - 3 sets - 2 reps - 15-20 sec hold Seated Piriformis Stretch - 1 x daily - 7 x weekly - 3 sets - 2 reps - 15-20 sec hold Hooklying Sequential Leg March and Lower - 1 x daily - 7 x weekly - 3 sets - 10 reps Dead Bug - 1 x daily - 7 x weekly - 3 sets - 10 reps

## 2021-01-09 ENCOUNTER — Other Ambulatory Visit: Payer: Self-pay

## 2021-01-09 ENCOUNTER — Ambulatory Visit: Payer: 59 | Admitting: Physical Therapy

## 2021-01-09 DIAGNOSIS — G8929 Other chronic pain: Secondary | ICD-10-CM

## 2021-01-09 DIAGNOSIS — M545 Low back pain, unspecified: Secondary | ICD-10-CM | POA: Diagnosis not present

## 2021-01-09 DIAGNOSIS — M6281 Muscle weakness (generalized): Secondary | ICD-10-CM

## 2021-01-09 NOTE — Therapy (Signed)
Dawn Hicks. [place], [place], [address] Phone: [phone]   Fax:  [phone]  Physical Therapy Treatment  Patient Details  Name: Dawn Hicks MRN: [id number] Date of Birth: 1976/03/03 Referring Provider (PT): Venida Jarvis   Encounter Date: 01/09/2021   PT End of Session - 01/09/21 1215    Visit Number 2    Date for PT Re-Evaluation 03/06/21    PT Start Time 4562    PT Stop Time 1215    PT Time Calculation (min) 40 min           Past Medical History:  Diagnosis Date  . Headache     No past surgical history on file.  There were no vitals filed for this visit.   Subjective Assessment - 01/09/21 1134    Subjective sitting is bad esp in car- I think I need a back support    Currently in Pain? Yes    Pain Score 5     Pain Location Back                             [facility] Adult PT Treatment/Exercise - 01/09/21 0001      Self-Care   Self-Care ADL's;Lifting   sitting with lumbar support, supine side sleeping     Exercises   Exercises Lumbar;Knee/Hip      Lumbar Exercises: Aerobic   Nustep L 5 6 min      Lumbar Exercises: Standing   Row Strengthening;Both;10 reps    Theraband Level (Row) Level 2 (Red)    Shoulder Extension Strengthening;Both;10 reps    Theraband Level (Shoulder Extension) Level 2 (Red)    Other Standing Lumbar Exercises hip flex and ext red tband      Lumbar Exercises: Seated   Other Seated Lumbar Exercises dyna disc pelvic ROM      Lumbar Exercises: Supine   Basic Lumbar Stabilization Compliant   ppt,bridge, abd and march with green tband                 PT Education - 01/09/21 1157    Education Details [email or web address]    Person(s) Educated Patient    Methods Explanation;Demonstration;Handout    Comprehension Verbalized understanding;Returned demonstration            PT Short Term Goals - 01/09/21 1215      PT SHORT TERM GOAL #1   Title Pt  will be I with initial HEP    Status Achieved             PT Long Term Goals - 01/04/21 1754      PT LONG TERM GOAL #1   Title Pt will report 50% reduction in LBP    Time 6    Period Weeks    Status New    Target Date 02/15/21      PT LONG TERM GOAL #2   Title Pt will demo lumbar AROM <25% limited with no increase in pain    Time 6    Period Weeks    Status New    Target Date 02/15/21      PT LONG TERM GOAL #3   Title Pt will report able to sit in car >30 minutes with no increase in LBP    Time 6    Period Weeks    Status New    Target Date 02/15/21      PT LONG TERM  GOAL #4   Title Pt will demo B hip abd/ext strength improved to 4+/5    Baseline 4/5    Time 6    Period Weeks    Status New    Target Date 02/15/21                 Plan - 01/09/21 1216    Clinical Impression Statement STG met. Progressed core stab and taught neutral pelvis with ex as she has increased lordosis.Educ on BM and lumbar support    PT Treatment/Interventions ADLs/Self Care Home Management;Electrical Stimulation;Iontophoresis 10m/ml Dexamethasone;Moist Heat;Neuromuscular re-education;Therapeutic exercise;Therapeutic activities;Patient/family education;Manual techniques;Passive range of motion    PT Next Visit Plan assess and progress           Patient will benefit from skilled therapeutic intervention in order to improve the following deficits and impairments:  Decreased range of motion,Difficulty walking,Increased muscle spasms,Pain,Decreased activity tolerance,Impaired flexibility,Decreased strength  Visit Diagnosis: Chronic bilateral low back pain without sciatica  Muscle weakness (generalized)     Problem List There are no problems to display for this patient.   Dannelle Rhymes,ANGIE PTA 01/09/2021, 12:18 PM  [place] [address]Phone: [phone]  Fax:  [phone] Name: Dawn MikelsonMRN: [id number] of Birth: [birth year]

## 2021-01-10 ENCOUNTER — Telehealth: Payer: Self-pay | Admitting: Neurology

## 2021-01-10 NOTE — Telephone Encounter (Signed)
Patient scheduled with Dr. Everlena Cooper 01/25/21 at 10:30 AM for follow up on eye twitching.  She'd like instructions on weaning off of venlafaxine XR 75 MG. Patient stated she thinks that medication is the cause of the twitching but she'll defer to Dr. Everlena Cooper.

## 2021-01-11 NOTE — Telephone Encounter (Signed)
We have not made any changes in medications for a while.  I really don't think it is related to any of the medications that I am prescribing.  The vast majority of eye twitch is related to stress.

## 2021-01-11 NOTE — Telephone Encounter (Signed)
Advised pt of Dr.jaffe note below. And to see what happen now and until her appt on 6/16. When there could be a full discussion with Dr.Jaffe.

## 2021-01-18 ENCOUNTER — Other Ambulatory Visit: Payer: Self-pay

## 2021-01-18 ENCOUNTER — Ambulatory Visit: Payer: 59 | Attending: Physician Assistant | Admitting: Physical Therapy

## 2021-01-18 DIAGNOSIS — R252 Cramp and spasm: Secondary | ICD-10-CM | POA: Diagnosis present

## 2021-01-18 DIAGNOSIS — G8929 Other chronic pain: Secondary | ICD-10-CM | POA: Insufficient documentation

## 2021-01-18 DIAGNOSIS — M6281 Muscle weakness (generalized): Secondary | ICD-10-CM | POA: Insufficient documentation

## 2021-01-18 DIAGNOSIS — M545 Low back pain, unspecified: Secondary | ICD-10-CM | POA: Diagnosis present

## 2021-01-18 NOTE — Therapy (Signed)
[place]. [place], [place], [address] Phone: [phone]   Fax:  [phone]  Physical Therapy Treatment  Patient Details  Name: Dawn Hicks MRN: [id number] Date of Birth: 28-Jun-1976 Referring Provider (PT): Venida Jarvis   Encounter Date: 01/18/2021   PT End of Session - 01/18/21 1742     Visit Number 3    Date for PT Re-Evaluation 03/06/21    PT Start Time 1700    PT Stop Time 1740    PT Time Calculation (min) 40 min             Past Medical History:  Diagnosis Date   Headache     No past surgical history on file.  There were no vitals filed for this visit.   Subjective Assessment - 01/18/21 1700     Subjective working on everything we talked about- traveled this week so more sore    Currently in Pain? Yes    Pain Score 7     Pain Location Back                               [facility] Adult PT Treatment/Exercise - 01/18/21 0001       Lumbar Exercises: Aerobic   UBE (Upper Arm Bike) L 2 2 fwd/2 back      Lumbar Exercises: Machines for Strengthening   Other Lumbar Machine Exercise row and lats 20# 2 sets 10      Lumbar Exercises: Standing   Wall Slides 10 reps   w/ball   Other Standing Lumbar Exercises wt ball OH trunk ext and obl 10 x each      Lumbar Exercises: Seated   Other Seated Lumbar Exercises pelvic ROM and stab on ball      Manual Therapy   Manual Therapy Passive ROM;Myofascial release    Manual therapy comments significant decrease in pain after    Myofascial Release LB and hips    Passive ROM LE and trunk                      PT Short Term Goals - 01/09/21 1215       PT SHORT TERM GOAL #1   Title Pt will be I with initial HEP    Status Achieved               PT Long Term Goals - 01/18/21 1742       PT LONG TERM GOAL #1   Title Pt will report 50% reduction in LBP    Status Partially Met      PT LONG TERM GOAL #2    Title Pt will demo lumbar AROM <25% limited with no increase in pain    Status Partially Met      PT LONG TERM GOAL #3   Title Pt will report able to sit in car >30 minutes with no increase in LBP    Status On-going      PT LONG TERM GOAL #4   Title Pt will demo B hip abd/ext strength improved to 4+/5    Status On-going                   Plan - 01/18/21 1743     Clinical Impression Statement STG met. Increased pain today but tolerated increased stab ex and machine interventions well. Great response an drelief with MT- educ on  home stretches.    PT Treatment/Interventions ADLs/Self Care Home Management;Electrical Stimulation;Iontophoresis 59m/ml Dexamethasone;Moist Heat;Neuromuscular re-education;Therapeutic exercise;Therapeutic activities;Patient/family education;Manual techniques;Passive range of motion    PT Next Visit Plan progress             Patient will benefit from skilled therapeutic intervention in order to improve the following deficits and impairments:  Decreased range of motion, Difficulty walking, Increased muscle spasms, Pain, Decreased activity tolerance, Impaired flexibility, Decreased strength  Visit Diagnosis: Muscle weakness (generalized)  Chronic bilateral low back pain without sciatica  Cramp and spasm     Problem List There are no problems to display for this patient.   Mercedees Convery,ANGIE PTA 01/18/2021, 5:44 PM  [place] [address]Phone: [phone]  Fax:  [phone] Name: Dawn NaumanMRN: [id number] of Birth: 7August 18, 1977

## 2021-01-25 ENCOUNTER — Ambulatory Visit: Payer: 59 | Admitting: Neurology

## 2021-01-25 ENCOUNTER — Ambulatory Visit: Payer: 59 | Admitting: Physical Therapy

## 2021-01-26 ENCOUNTER — Other Ambulatory Visit: Payer: Self-pay

## 2021-01-26 DIAGNOSIS — M545 Low back pain, unspecified: Secondary | ICD-10-CM

## 2021-01-29 ENCOUNTER — Ambulatory Visit: Payer: 59 | Admitting: Physical Therapy

## 2021-01-29 ENCOUNTER — Other Ambulatory Visit: Payer: Self-pay

## 2021-01-29 DIAGNOSIS — M6281 Muscle weakness (generalized): Secondary | ICD-10-CM | POA: Diagnosis not present

## 2021-01-29 DIAGNOSIS — M545 Low back pain, unspecified: Secondary | ICD-10-CM

## 2021-01-29 DIAGNOSIS — G8929 Other chronic pain: Secondary | ICD-10-CM

## 2021-01-29 NOTE — Therapy (Signed)
[place]. [place], [place], [address] Phone: [phone]   Fax:  [phone]  Physical Therapy Treatment  Patient Details  Name: Dawn Hicks MRN: [id number] Date of Birth: 11-17-1975 Referring Provider (PT): Venida Jarvis   Encounter Date: 01/29/2021   PT End of Session - 01/29/21 1134     Visit Number 4    Date for PT Re-Evaluation 03/06/21    PT Start Time 1100    PT Stop Time 1150    PT Time Calculation (min) 50 min             Past Medical History:  Diagnosis Date   Headache     No past surgical history on file.  There were no vitals filed for this visit.   Subjective Assessment - 01/29/21 1101     Subjective kick of online training at work so kinda flared up esp with leaning fwd and sitting, standing better 2/10 standing 8/10 sitting/bending fwd                               [facility] Adult PT Treatment/Exercise - 01/29/21 0001       Lumbar Exercises: Aerobic   UBE (Upper Arm Bike) L 2 2 fwd/2 back   standing     Lumbar Exercises: Machines for Strengthening   Cybex Lumbar Extension 20x black tband    Other Lumbar Machine Exercise row and lats 20# 2 sets 12      Lumbar Exercises: Standing   Other Standing Lumbar Exercises wt ball OH trunk ext and obl 10 x each    Other Standing Lumbar Exercises pulley scap stab 12 x each   step up opp leg ext 10 x     Modalities   Modalities Traction      Traction   Type of Traction Lumbar    Max (lbs) 50    Time 15- static                      PT Short Term Goals - 01/09/21 1215       PT SHORT TERM GOAL #1   Title Pt will be I with initial HEP    Status Achieved               PT Long Term Goals - 01/18/21 1742       PT LONG TERM GOAL #1   Title Pt will report 50% reduction in LBP    Status Partially Met      PT LONG TERM GOAL #2   Title Pt will demo lumbar AROM <25% limited with no increase  in pain    Status Partially Met      PT LONG TERM GOAL #3   Title Pt will report able to sit in car >30 minutes with no increase in LBP    Status On-going      PT LONG TERM GOAL #4   Title Pt will demo B hip abd/ext strength improved to 4+/5    Status On-going                   Plan - 01/29/21 1135     Clinical Impression Statement postural cuing needed with ther ex, some pain with flexion and ext mvmt.responded well to traction. educ pt on LBP with mvmts, pssible disc issues and strengthening to protect spine    PT Treatment/Interventions  ADLs/Self Care Home Management;Electrical Stimulation;Iontophoresis 42m/ml Dexamethasone;Moist Heat;Neuromuscular re-education;Therapeutic exercise;Therapeutic activities;Patient/family education;Manual techniques;Passive range of motion    PT Next Visit Plan assess and progress             Patient will benefit from skilled therapeutic intervention in order to improve the following deficits and impairments:  Decreased range of motion, Difficulty walking, Increased muscle spasms, Pain, Decreased activity tolerance, Impaired flexibility, Decreased strength  Visit Diagnosis: Chronic bilateral low back pain without sciatica  Muscle weakness (generalized)     Problem List There are no problems to display for this patient.   Kaulana Brindle,ANGIE PTA 01/29/2021, 11:37 AM  [place] [address]Phone: [phone]  Fax:  [phone] Name: Dawn DawkinsMRN: [id number] of Birth: 7August 28, 1977

## 2021-02-02 ENCOUNTER — Other Ambulatory Visit (HOSPITAL_BASED_OUTPATIENT_CLINIC_OR_DEPARTMENT_OTHER): Payer: Self-pay

## 2021-02-02 ENCOUNTER — Ambulatory Visit: Payer: 59 | Attending: Internal Medicine

## 2021-02-02 ENCOUNTER — Other Ambulatory Visit: Payer: Self-pay

## 2021-02-02 DIAGNOSIS — Z23 Encounter for immunization: Secondary | ICD-10-CM

## 2021-02-02 NOTE — Progress Notes (Signed)
   Covid-19 Vaccination Clinic  Name:  Dawn Hicks    MRN: [id number] DOB: [birth year]  02/02/2021  [name] was observed post Covid-19 immunization for 15 minutes without incident. She was provided with Vaccine Information Sheet and instruction to access the V-Safe system.   [name] was instructed to call 911 with any severe reactions post vaccine: Difficulty breathing  Swelling of face and throat  A fast heartbeat  A bad rash all over body  Dizziness and weakness   Immunizations Administered     Name Date Dose VIS Date Route   PFIZER Comrnaty(Gray TOP) Covid-19 Vaccine 02/02/2021  2:47 PM 0.3 mL 07/20/2020 Intramuscular   Manufacturer: [employer], [employer]   Lot: [id number]   NDC: [phone]

## 2021-02-09 ENCOUNTER — Other Ambulatory Visit: Payer: Self-pay

## 2021-02-09 ENCOUNTER — Ambulatory Visit: Payer: 59 | Attending: Physician Assistant | Admitting: Physical Therapy

## 2021-02-09 DIAGNOSIS — M6281 Muscle weakness (generalized): Secondary | ICD-10-CM | POA: Insufficient documentation

## 2021-02-09 DIAGNOSIS — G8929 Other chronic pain: Secondary | ICD-10-CM | POA: Diagnosis present

## 2021-02-09 DIAGNOSIS — M545 Low back pain, unspecified: Secondary | ICD-10-CM | POA: Diagnosis present

## 2021-02-09 NOTE — Therapy (Signed)
[place]. [place], [place], [address] Phone: [phone]   Fax:  [phone]  Physical Therapy Treatment  Patient Details  Name: Dawn Hicks MRN: [id number] Date of Birth: April 04, 1976 Referring Provider (PT): Venida Jarvis   Encounter Date: 02/09/2021   PT End of Session - 02/09/21 1145     Visit Number 5    Date for PT Re-Evaluation 03/06/21    PT Start Time 1103    PT Stop Time 1145    PT Time Calculation (min) 42 min             Past Medical History:  Diagnosis Date   Headache     No past surgical history on file.  There were no vitals filed for this visit.   Subjective Assessment - 02/09/21 1109     Subjective i was doing great until had to stay in hospital for 2 days with someone and standing on hard floor and sit on bench    Currently in Pain? Yes    Pain Score 5                                [facility] Adult PT Treatment/Exercise - 02/09/21 0001       Lumbar Exercises: Aerobic   Nustep L 5 6 min      Lumbar Exercises: Machines for Strengthening   Cybex Lumbar Extension 20x black tband    Other Lumbar Machine Exercise row and lats 20# 2 sets 12      Lumbar Exercises: Supine   Advanced Lumbar Stabilization Limitations 15 min      Lumbar Exercises: Quadruped   Opposite Arm/Leg Raise Left arm/Right leg;Right arm/Left leg;20 reps;2 seconds      Knee/Hip Exercises: Standing   Forward Step Up Both;10 reps;Step Height: 2";Step Height: 6"   opp hip ext     Manual Therapy   Manual Therapy Passive ROM;Myofascial release;Manual Traction    Myofascial Release LB and hips    Passive ROM LE and trunk    Manual Traction SI distration                      PT Short Term Goals - 01/09/21 1215       PT SHORT TERM GOAL #1   Title Pt will be I with initial HEP    Status Achieved               PT Long Term Goals - 02/09/21 1145       PT LONG TERM  GOAL #1   Title Pt will report 50% reduction in LBP    Status Partially Met      PT LONG TERM GOAL #2   Title Pt will demo lumbar AROM <25% limited with no increase in pain    Status Achieved      PT LONG TERM GOAL #3   Title Pt will report able to sit in car >30 minutes with no increase in LBP    Status Partially Met      PT LONG TERM GOAL #4   Title Pt will demo B hip abd/ext strength improved to 4+/5    Status Partially Met                   Plan - 02/09/21 1145     Clinical Impression Statement progressing with goals, overall doing well but  set back after prolonged stilling on bench and standing. felt better after PROM/stretching. reviewed core HEP and stab    PT Treatment/Interventions ADLs/Self Care Home Management;Electrical Stimulation;Iontophoresis 29m/ml Dexamethasone;Moist Heat;Neuromuscular re-education;Therapeutic exercise;Therapeutic activities;Patient/family education;Manual techniques;Passive range of motion    PT Next Visit Plan MRI next weeka nd MD f/u-pt to call after re:PT             Patient will benefit from skilled therapeutic intervention in order to improve the following deficits and impairments:  Decreased range of motion, Difficulty walking, Increased muscle spasms, Pain, Decreased activity tolerance, Impaired flexibility, Decreased strength  Visit Diagnosis: Muscle weakness (generalized)  Chronic bilateral low back pain without sciatica     Problem List There are no problems to display for this patient.   Rollen Selders,ANGIE PTA 02/09/2021, 11:48 AM  [place] [address]Phone: [phone]  Fax:  [phone] Name: Dawn CavallaroMRN: [id number] of Birth: [birth year]

## 2021-02-13 ENCOUNTER — Telehealth: Payer: Self-pay | Admitting: Orthopaedic Surgery

## 2021-02-13 ENCOUNTER — Ambulatory Visit
Admission: RE | Admit: 2021-02-13 | Discharge: 2021-02-13 | Disposition: A | Discharge status: Home / Self Care | Payer: 59 | Source: Ambulatory Visit | Attending: Orthopaedic Surgery | Admitting: Orthopaedic Surgery

## 2021-02-13 ENCOUNTER — Other Ambulatory Visit: Payer: Self-pay

## 2021-02-13 DIAGNOSIS — M545 Low back pain, unspecified: Secondary | ICD-10-CM

## 2021-02-13 IMAGING — MR MR LUMBAR SPINE W/O CM
4 of 5 series · 24 of 48 positions shown · non-contrast
Comparison: None.

CLINICAL DATA: Low back pain with bilateral hip and knee pain

EXAM:
MRI LUMBAR SPINE WITHOUT CONTRAST
TECHNIQUE: Multiplanar, multisequence MR imaging of the lumbar spine was
performed. No intravenous contrast was administered.

[Series 5: T2 · sagittal · 4.0mm · 0.88mm/px · 6 of 16 slices shown (1 of 2)]
[im 1/16]
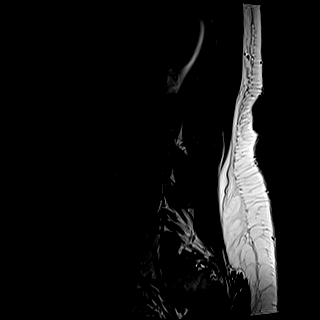
[im 4/16]
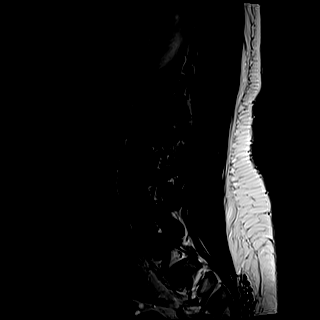
[im 7/16]
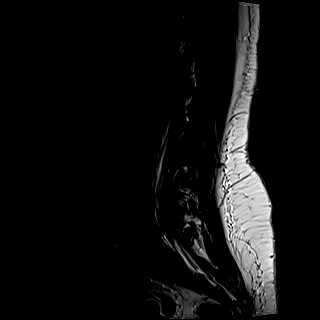
[im 10/16]
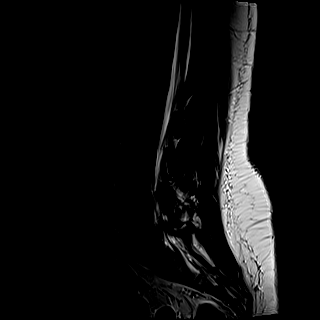
[im 13/16]
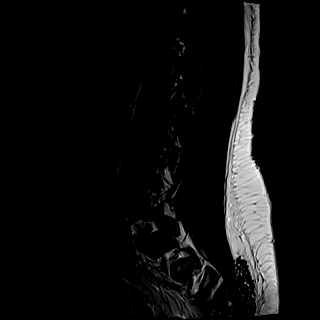
[im 16/16]
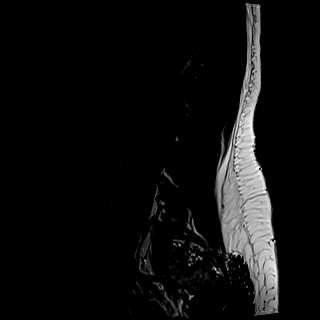

[Series 6: T1 · sagittal · 4.0mm · 0.88mm/px · 6 of 16 slices shown (1 of 2)]
[im 1/16]
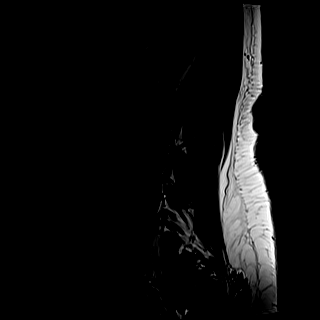
[im 4/16]
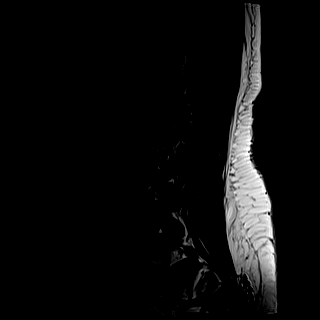
[im 7/16]
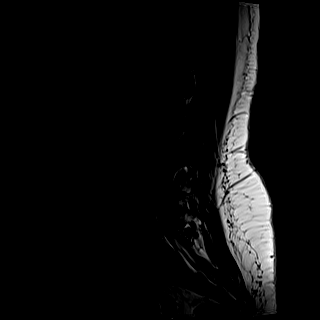
[im 10/16]
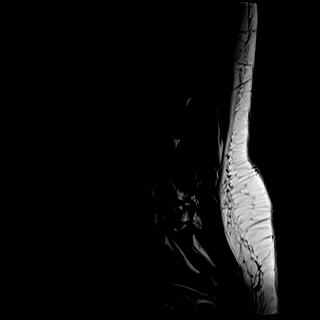
[im 13/16]
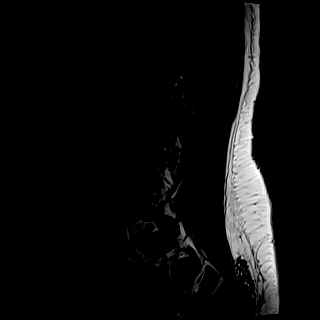
[im 16/16]
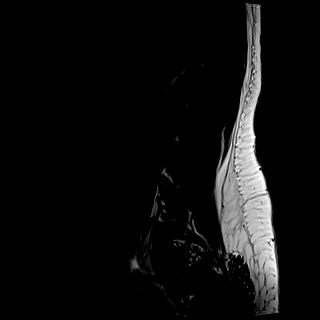

[Series 10: T1 · axial · 4.0mm · 0.28mm/px · z∈[+30,+186]mm · 3 of 40 slices shown (2 of 2)]
[im 6/40]
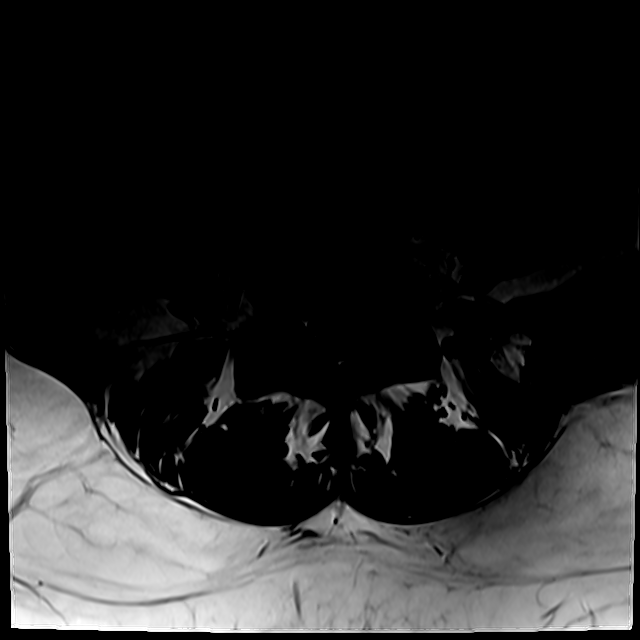
[im 20/40]
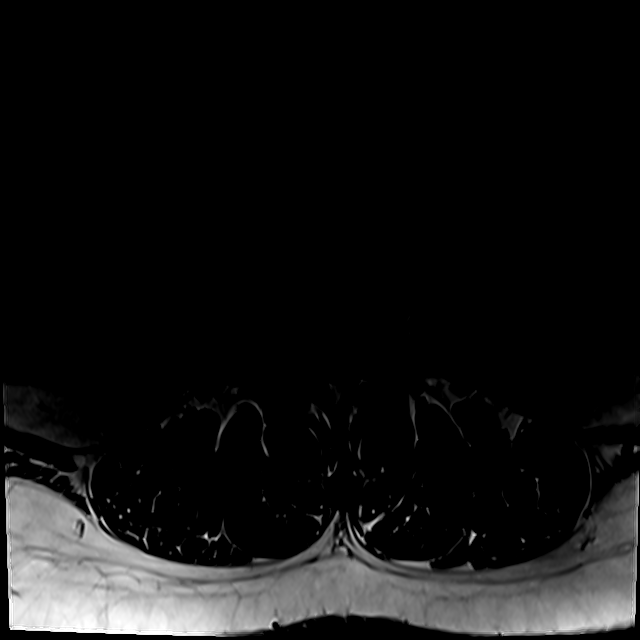
[im 34/40]
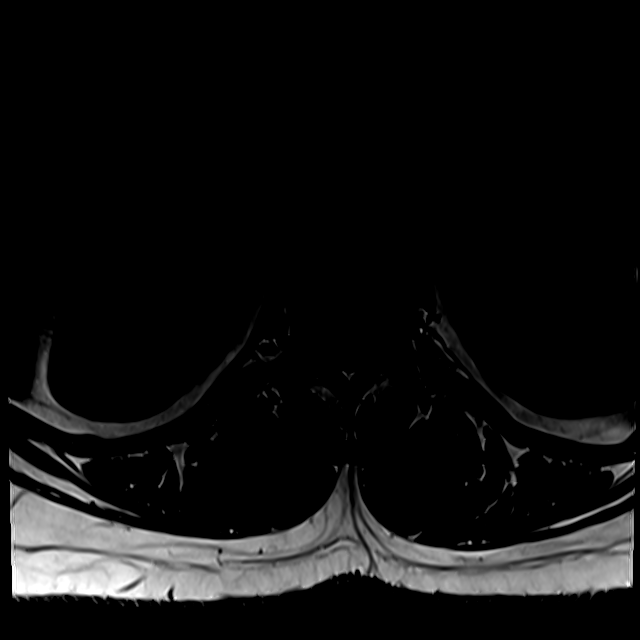

[Series 13: T2 · axial · 4.0mm · 0.35mm/px · z∈[+2,+216]mm · 9 of 40 slices shown (2 of 2)]
[im 1/40]
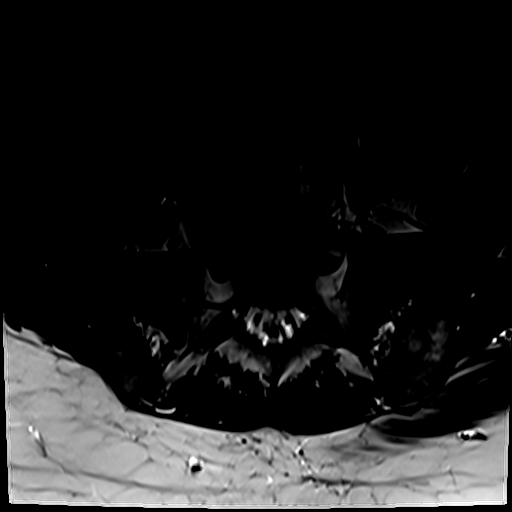
[im 6/40]
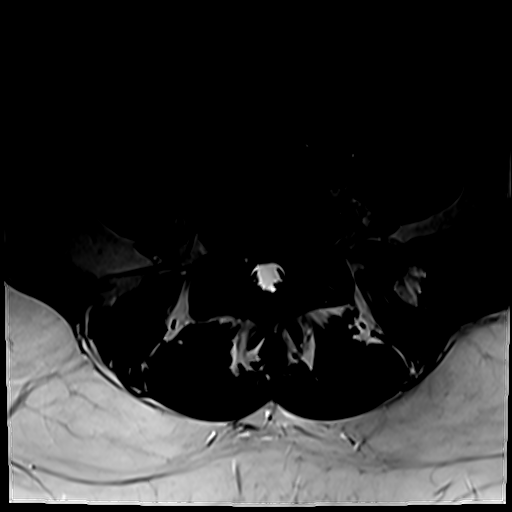
[im 12/40]
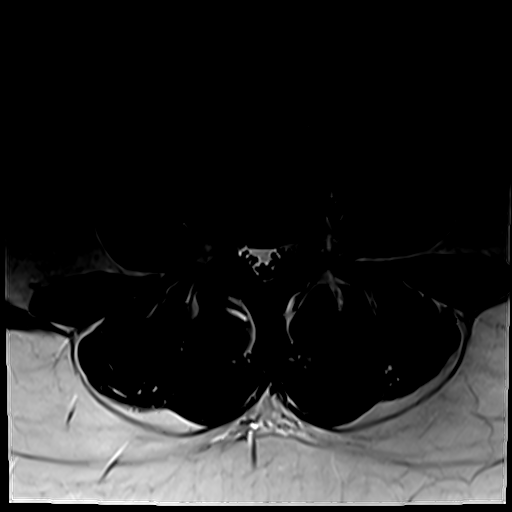
[im 17/40]
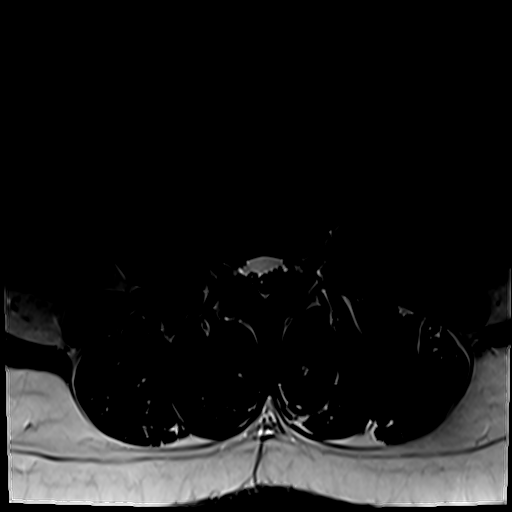
[im 20/40]
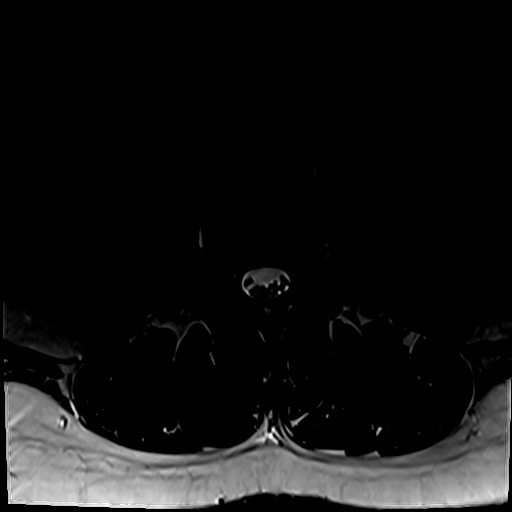
[im 23/40]
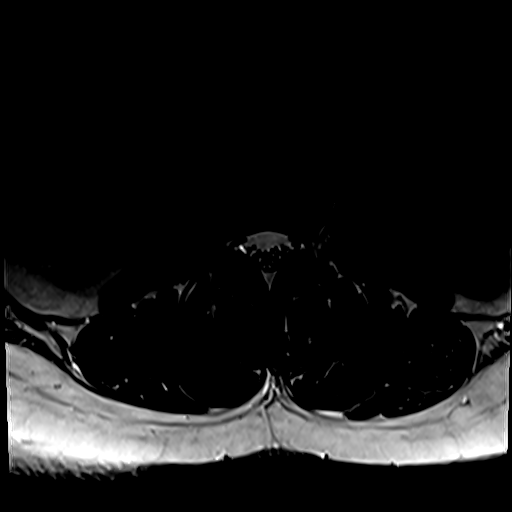
[im 28/40]
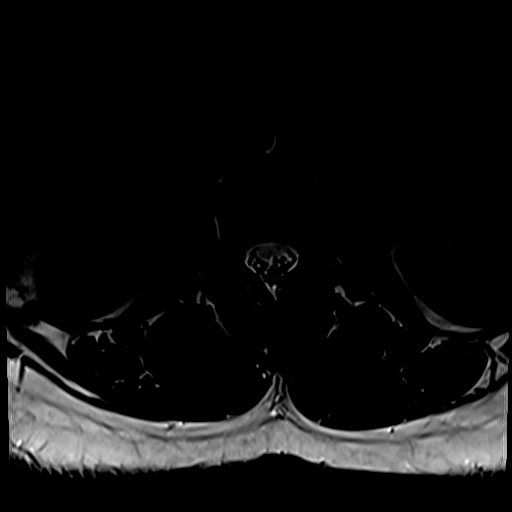
[im 34/40]
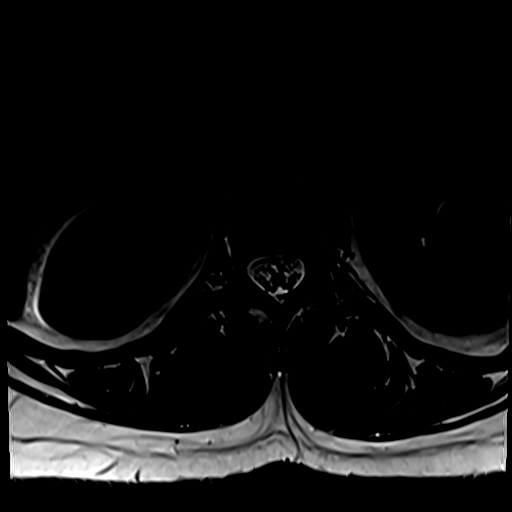
[im 40/40]
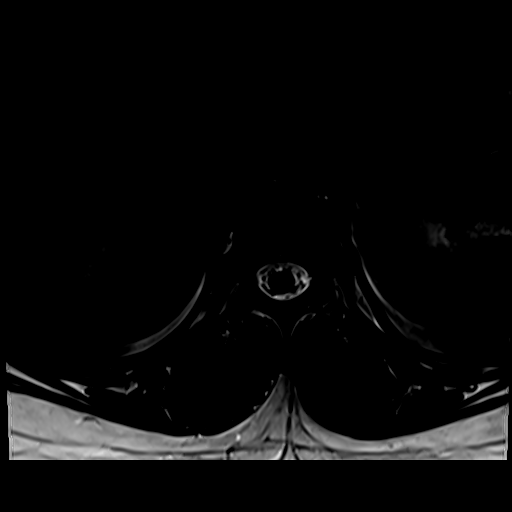

[24 of 48 positions shown; findings below may reference images not displayed]

FINDINGS: Segmentation:  Standard.

Alignment:  Physiologic.

Vertebrae:  No fracture, evidence of discitis, or bone lesion.

Conus medullaris and cauda equina: Conus extends to the L1 level.
Conus and cauda equina appear normal.

Paraspinal and other soft tissues: Negative.

Disc levels:

The T11-L4 levels are normal.

L4-5: Small central disc protrusion. No spinal canal or neural
foraminal stenosis.

L5-S1: Small central disc protrusion. Mild bilateral lateral recess
narrowing. No central spinal canal or neural foraminal stenosis.
IMPRESSION: 1. Mild bilateral lateral recess narrowing at L5-S1 due to small
central disc protrusion. Correlate for S1 radiculopathy.
2. Small central disc protrusion at L4-L5 without associated
stenosis.

## 2021-02-13 NOTE — Telephone Encounter (Signed)
This is a XU pt that is sch for MRI L spine today. Follow up appt for results 02/21/21 requesting rx for pain.

## 2021-02-14 NOTE — Telephone Encounter (Signed)
See message.     Thank you!

## 2021-02-15 ENCOUNTER — Other Ambulatory Visit: Payer: Self-pay | Admitting: Physician Assistant

## 2021-02-15 MED ORDER — TRAMADOL HCL 50 MG PO TABS: 50.0000 mg | ORAL_TABLET | Freq: Three times a day (TID) | ORAL | 0 refills | Status: DC | PRN

## 2021-02-15 NOTE — Telephone Encounter (Signed)
Patient aware.

## 2021-02-15 NOTE — Telephone Encounter (Signed)
Sent in tramadol

## 2021-02-21 ENCOUNTER — Ambulatory Visit (INDEPENDENT_AMBULATORY_CARE_PROVIDER_SITE_OTHER): Payer: 59 | Admitting: Orthopaedic Surgery

## 2021-02-21 ENCOUNTER — Ambulatory Visit (INDEPENDENT_AMBULATORY_CARE_PROVIDER_SITE_OTHER): Payer: 59

## 2021-02-21 ENCOUNTER — Encounter: Payer: Self-pay | Admitting: Orthopaedic Surgery

## 2021-02-21 DIAGNOSIS — M542 Cervicalgia: Secondary | ICD-10-CM | POA: Diagnosis not present

## 2021-02-21 DIAGNOSIS — M545 Low back pain, unspecified: Secondary | ICD-10-CM | POA: Diagnosis not present

## 2021-02-21 DIAGNOSIS — M25562 Pain in left knee: Secondary | ICD-10-CM

## 2021-02-21 DIAGNOSIS — G8929 Other chronic pain: Secondary | ICD-10-CM

## 2021-02-21 MED ORDER — TRAMADOL HCL 50 MG PO TABS: 50.0000 mg | ORAL_TABLET | Freq: Three times a day (TID) | ORAL | 2 refills | Status: DC | PRN

## 2021-02-21 MED ORDER — DICLOFENAC SODIUM 75 MG PO TBEC: 75.0000 mg | DELAYED_RELEASE_TABLET | Freq: Two times a day (BID) | ORAL | 0 refills | Status: DC | PRN

## 2021-02-21 NOTE — Progress Notes (Signed)
Office Visit Note   Patient: Dawn Hicks           Date of Birth: 23-Feb-1976           MRN: [id number] Visit Date: 02/21/2021              Requested by: Deatra James, MD [phone] [address] [place],  [place] [address] PCP: Deatra James, MD   Assessment & Plan: Visit Diagnoses:  1. Chronic pain of left knee   2. Cervicalgia   3. Low back pain, unspecified back pain laterality, unspecified chronicity, unspecified whether sciatica present     Plan: Impression is chronic back and neck pain in addition to new onset bilateral knee pain medial compartment.  In regards to her neck and back, she has not had improvement from steroids or several months of physical therapy.  I would like to refer her to Dr. Alvester Morin for epidural steroid injection of her back and I would like to refer her out for cervical spine MRI.  She will follow-up with [place] once the MRIs been completed.  In regards to her knees, her MRI does show central disc protrusion at L4-5 which could subsequently cause referred pain to her knees, however she is very tender on exam so I believe it is worth trying a diagnostic and hopefully therapeutic cortisone injection to the left, more symptomatic knee.  If she gets relief from this injection, she will follow-up for right knee injection.  Call with concerns or questions in the meantime.  Follow-Up Instructions: Return for after cervical spine mri.   Orders:  Orders Placed This Encounter  Procedures   Large Joint Inj: L knee   XR KNEE 3 VIEW LEFT   MR Cervical Spine w/o contrast   Ambulatory referral to Physical Medicine Rehab   Meds ordered this encounter  Medications   traMADol (ULTRAM) 50 MG tablet    Sig: Take 1 tablet (50 mg total) by mouth 3 (three) times daily as needed.    Dispense:  60 tablet    Refill:  2   diclofenac (VOLTAREN) 75 MG EC tablet    Sig: Take 1 tablet (75 mg total) by mouth 2 (two) times daily as needed.    Dispense:  60 tablet    Refill:  0        Procedures: Large Joint Inj: L knee on 02/21/2021 11:49 AM Indications: pain Details: 22 G needle, anterolateral approach Medications: 2 mL lidocaine 1 %; 2 mL bupivacaine 0.25 %; 40 mg methylPREDNISolone acetate 40 MG/ML     Clinical Data: No additional findings.   Subjective: Chief Complaint  Patient presents with   Lower Back - Pain   Left Knee - Pain    HPI patient is a pleasant 45 year old female who comes in today to discuss MRI results of her lumbar spine.  She has been dealing with chronic back and neck pain for many months.  She has most recently been in physical therapy over the past few months for both her neck and back.  She notes minimal relief in symptoms.  Subsequent MRI of the lumbar spine ordered which shows mild bilateral lateral recess narrowing L5-S1 from a small central disc protrusion in addition to a small central disc protrusion at L4-5.  She has not previously undergone epidural steroid injection to the neck or back.  No recent MRI of the neck.  Over the past several weeks she is noticed increasing bilateral knee pain medial aspect.  This began about a week after receiving her second COVID booster.  She is not sure whether there is a correlation there.  The pain she has is constant but worse with activity and especially at night and going up and down stairs.  She has been taking tramadol with mild relief.  No previous cortisone injection to either knee.  Review of Systems as detailed in HPI.  All others reviewed and are negative.   Objective: Vital Signs: There were no vitals taken for this visit.  Physical Exam well-developed well-nourished female no acute distress.  Alert and oriented x3.  Ortho Exam bilateral knee exam shows no effusion.  No skin changes.  Range of motion 0 to 120 degrees.  She does have moderate medial joint line tenderness both sides.  Ligaments are stable.  She is neurovascular intact distally.  Negative logroll and negative  FADIR both sides.  Positive straight leg raise both sides.  Increased pain with cervical spine flexion and extension as well as spinous and paraspinous tenderness both sides.  She is neurovascular intact distally.  No focal weakness.  Specialty Comments:  No specialty comments available.  Imaging: XR KNEE 3 VIEW LEFT  Result Date: 02/22/2021 Mild joint space narrowing medial compartment both knees    PMFS History: There are no problems to display for this patient.  Past Medical History:  Diagnosis Date   Headache     Family History  Problem Relation Age of Onset   Breast cancer Maternal Aunt     History reviewed. No pertinent surgical history. Social History   Occupational History   Not on file  Tobacco Use   Smoking status: Never   Smokeless tobacco: Never  Vaping Use   Vaping Use: Never used  Substance and Sexual Activity   Alcohol use: Never   Drug use: Not Currently   Sexual activity: Yes

## 2021-02-22 MED ORDER — METHYLPREDNISOLONE ACETATE 40 MG/ML IJ SUSP
40.0000 mg | INTRAMUSCULAR | Status: AC | PRN
  Administered 2021-02-21: 40 mg via INTRA_ARTICULAR

## 2021-02-22 MED ORDER — LIDOCAINE HCL 1 % IJ SOLN
2.0000 mL | INTRAMUSCULAR | Status: AC | PRN
Start: 2021-02-21 — End: 2021-02-21
  Administered 2021-02-21: 2 mL

## 2021-02-22 MED ORDER — BUPIVACAINE HCL 0.25 % IJ SOLN
2.0000 mL | INTRAMUSCULAR | Status: AC | PRN
  Administered 2021-02-21: 2 mL via INTRA_ARTICULAR

## 2021-03-02 ENCOUNTER — Ambulatory Visit
Admission: RE | Admit: 2021-03-02 | Discharge: 2021-03-02 | Disposition: A | Discharge status: Home / Self Care | Payer: 59 | Source: Ambulatory Visit | Attending: Orthopaedic Surgery | Admitting: Orthopaedic Surgery

## 2021-03-02 DIAGNOSIS — M542 Cervicalgia: Secondary | ICD-10-CM

## 2021-03-02 IMAGING — MR MR CERVICAL SPINE W/O CM
5 series · 36 of 48 positions shown · non-contrast
Comparison: None.

CLINICAL DATA: Neck pain, chronic prominent degenerative changes on
x-ray. Patient states pain is progressive.

EXAM:
MRI CERVICAL SPINE WITHOUT CONTRAST
TECHNIQUE: Multiplanar, multisequence MR imaging of the cervical spine was
performed. No intravenous contrast was administered.

[Series 2: T2 · sagittal · 3.0mm · 0.41mm/px · 8 of 17 slices shown (1 of 2)]
[im 1/17]
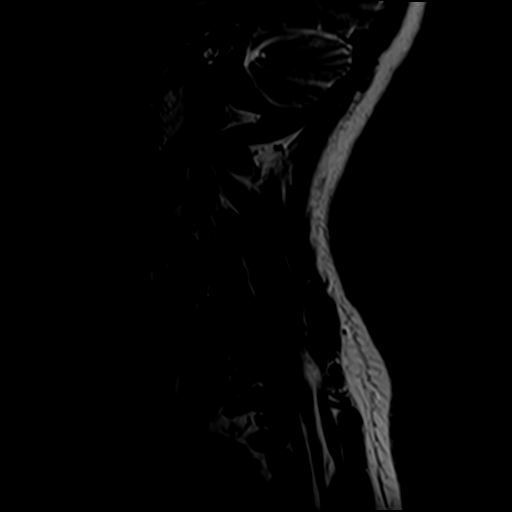
[im 3/17]
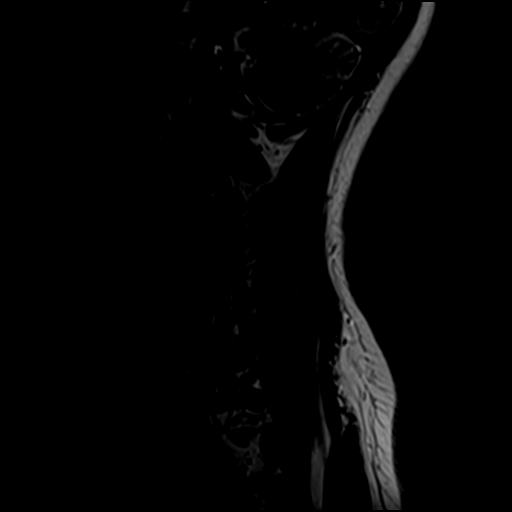
[im 5/17]
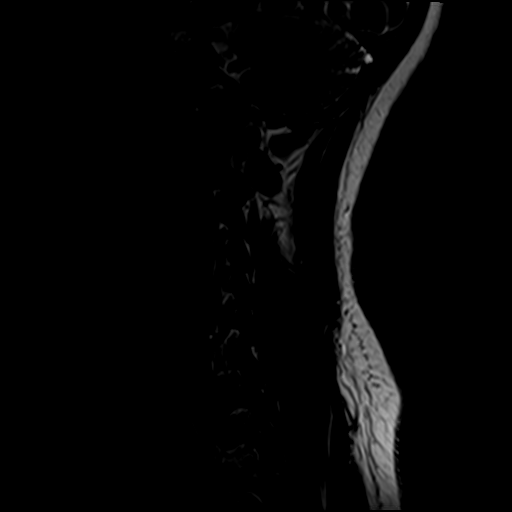
[im 7/17]
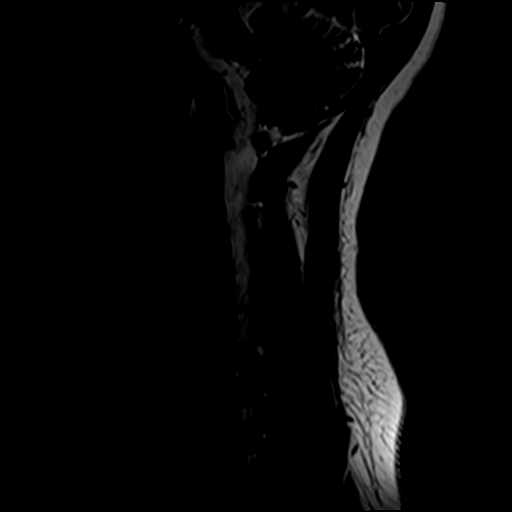
[im 10/17]
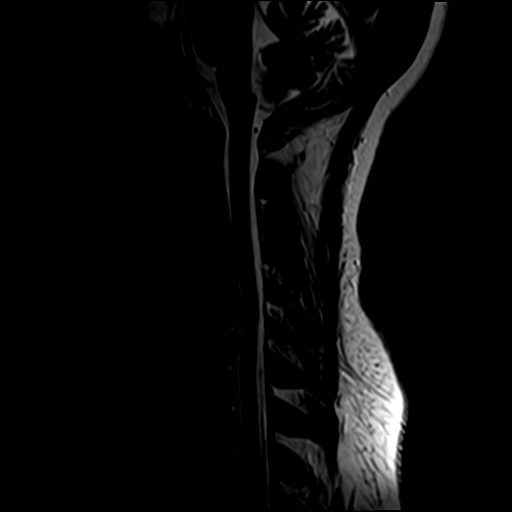
[im 12/17]
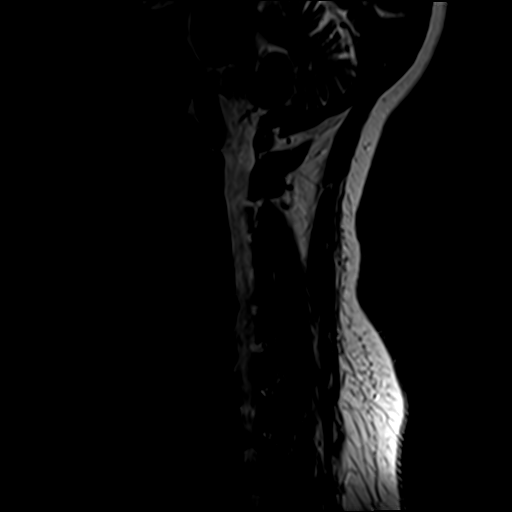
[im 14/17]
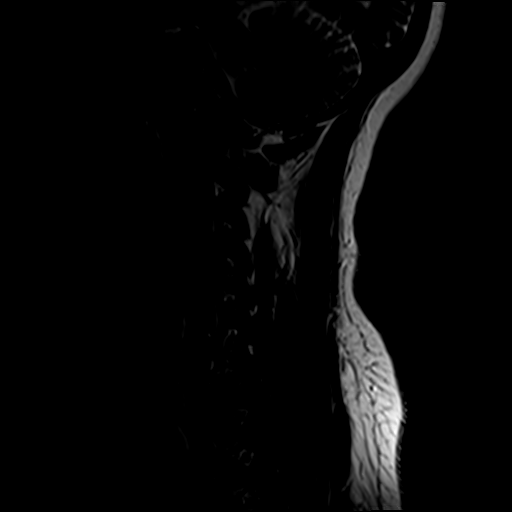
[im 17/17]
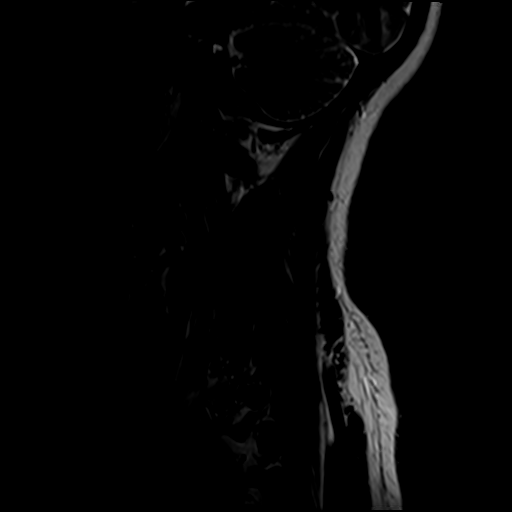

[Series 3: STIR · sagittal · 3.0mm · 0.82mm/px · 8 of 17 slices shown]
[im 1/17]
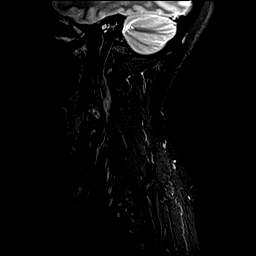
[im 3/17]
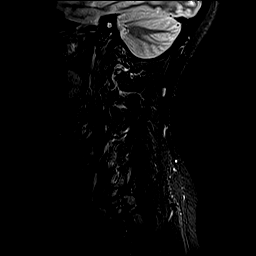
[im 5/17]
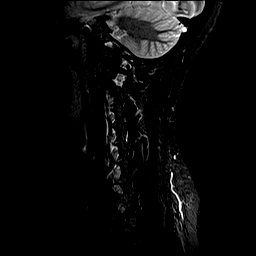
[im 7/17]
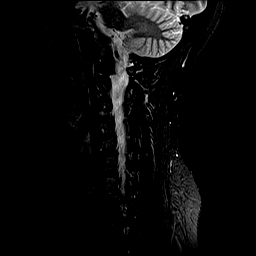
[im 10/17]
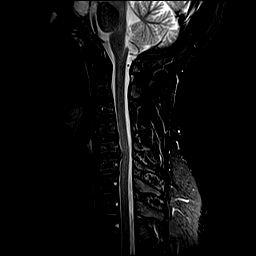
[im 12/17]
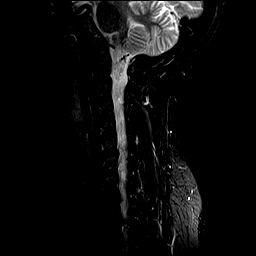
[im 14/17]
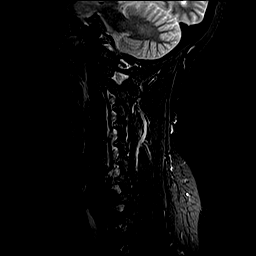
[im 17/17]
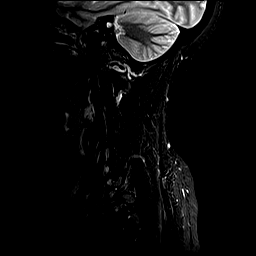

[Series 4: T1 · sagittal · 3.0mm · 0.82mm/px · 8 of 17 slices shown]
[im 1/17]
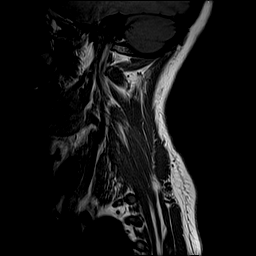
[im 3/17]
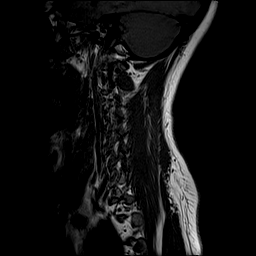
[im 5/17]
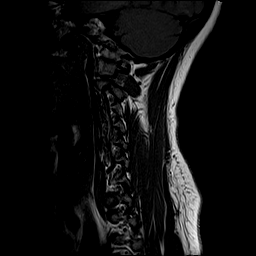
[im 7/17]
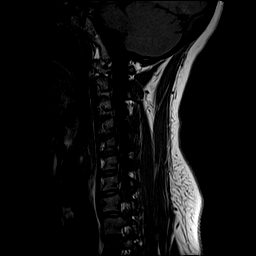
[im 10/17]
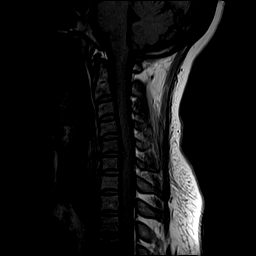
[im 12/17]
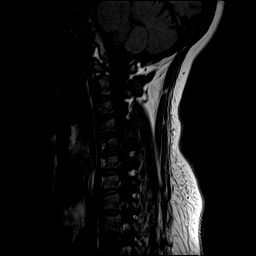
[im 14/17]
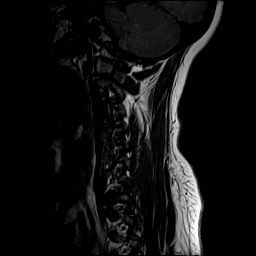
[im 17/17]
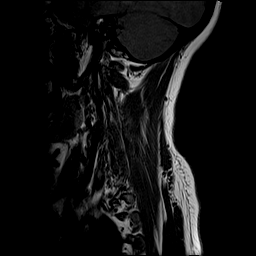

[Series 5: T2 · axial · 3.0mm · 0.70mm/px · z∈[-41,+50]mm · 9 of 26 slices shown (2 of 2)]
[im 1/26]
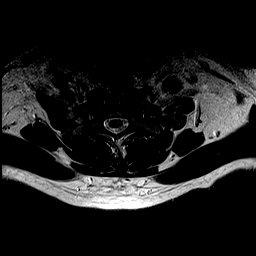
[im 5/26]
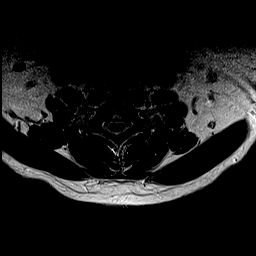
[im 7/26]
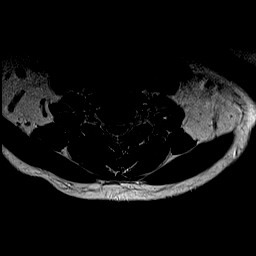
[im 12/26]
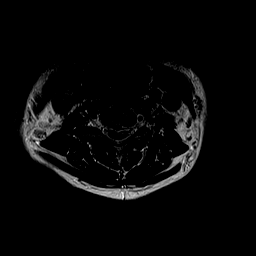
[im 14/26]
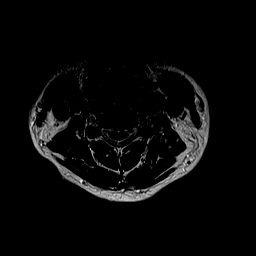
[im 19/26]
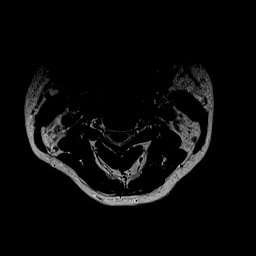
[im 21/26]
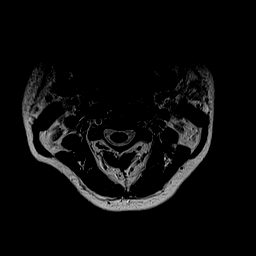
[im 23/26]
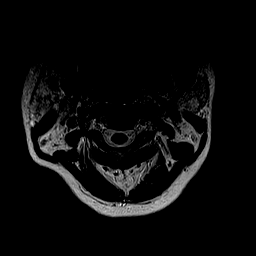
[im 26/26]
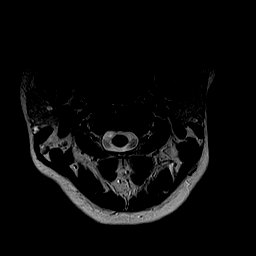

[Series 6: GRE · axial · 3.0mm · 0.35mm/px · z∈[-41,-19]mm · 3 of 26 slices shown]
[im 1/26]
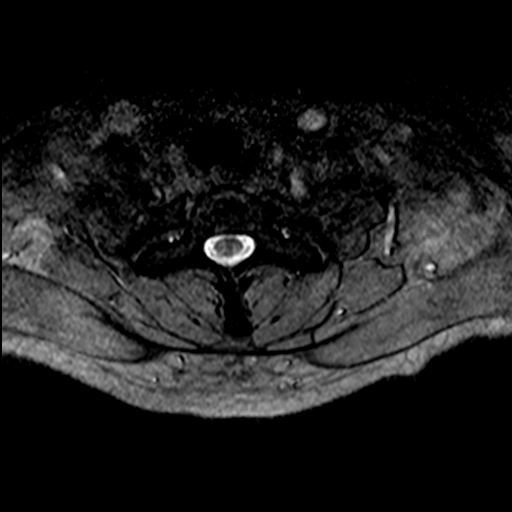
[im 5/26]
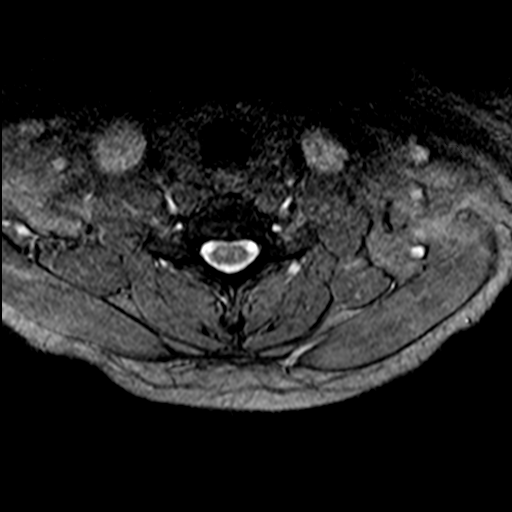
[im 7/26]
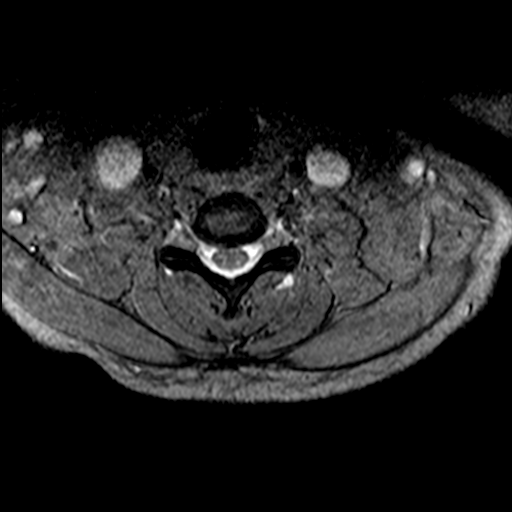

[36 of 48 positions shown; findings below may reference images not displayed]

FINDINGS: Alignment: No significant listhesis present. Straightening of the
normal cervical lordosis is noted.

Vertebrae: Marrow signal and vertebral body heights are normal.

Cord: Ventral distortion of the cord noted at C5-6. No abnormal
signal. Cord signal and morphology otherwise normal.

Posterior Fossa, vertebral arteries, paraspinal tissues: Expanded
empty sella noted. Residual pituitary tissue within normal limits.
Posterior fossa structures normal.

Disc levels:

C2-3: Negative.

C3-4: Negative.

C4-5: Shallow central disc protrusion is present. No significant
stenosis is present.

C5-6: More prominent central disc protrusion contacts and distorts
the ventral surface cord. Mild left uncovertebral spurring noted
without significant stenosis.

C6-7: Negative.

C7-T1: Negative.
IMPRESSION: 1. Central disc protrusion at C5-6 with contact and distortion of
the ventral surface of the cord but no abnormal signal.
2. Shallow central disc protrusion at C4-5 without significant
stenosis.
3. Expanded empty sella. This is nonspecific, but can be seen in the
setting of idiopathic intracranial hypertension.

## 2021-03-05 ENCOUNTER — Other Ambulatory Visit: Payer: 59

## 2021-03-06 ENCOUNTER — Other Ambulatory Visit: Payer: Self-pay

## 2021-03-06 ENCOUNTER — Encounter: Payer: Self-pay | Admitting: Orthopaedic Surgery

## 2021-03-06 ENCOUNTER — Ambulatory Visit: Payer: 59 | Admitting: Orthopaedic Surgery

## 2021-03-06 ENCOUNTER — Ambulatory Visit (INDEPENDENT_AMBULATORY_CARE_PROVIDER_SITE_OTHER): Payer: 59 | Admitting: Orthopaedic Surgery

## 2021-03-06 DIAGNOSIS — M542 Cervicalgia: Secondary | ICD-10-CM

## 2021-03-06 NOTE — Progress Notes (Signed)
   Office Visit Note   Patient: Dawn Hicks           Date of Birth: July 27, 1976           MRN: [id number] Visit Date: 03/06/2021              Requested by: Deatra James, MD [phone] [address] [place],  [place] [address] PCP: Deatra James, MD   Assessment & Plan: Visit Diagnoses:  1. Cervicalgia     Plan: Impression is chronic cervical spine pain with MRI findings of central disc protrusion C5-6 with contact and distortion of the ventral surface of the cord but no abnormal signal.  Also noted is a shallow disc protrusion C4-5 without significant stenosis.  Incidental finding of expanded empty sella which could be seen with idiopathic intracranial hypertension.  This was discussed with the patient.  We will refer her to Dr. Alvester Morin for epidural steroid injection as she has failed greater than 6 weeks of formal physical therapy in addition to a steroid taper.  In regards to the intracranial hypertension, she has been dealing with chronic migraines since she was 45 years old and has had occipital blocks and Botox injections.  We will refer her back to her current neurologist and primary care physician for further evaluation and treatment recommendation.  Follow-up with [place] as needed.  Follow-Up Instructions: Return if symptoms worsen or fail to improve.   Orders:  Orders Placed This Encounter  Procedures   Ambulatory referral to Physical Medicine Rehab   No orders of the defined types were placed in this encounter.     Procedures: No procedures performed   Clinical Data: No additional findings.   Subjective: Chief Complaint  Patient presents with   Lower Back - Pain    HPI patient is a pleasant 45 year old female who comes in today to discuss cervical spine MRI.  She has been dealing with chronic neck pain for quite some time.  She has been to physical therapy and has tried a course of steroids without relief.  Subsequent MRI of the cervical spine was ordered.  MRI  shows central disc protrusion C5-6 with contact and distortion of the ventral surface of the cord but no abnormal signal.  Also noted is a shallow disc protrusion C4-5 without significant stenosis.  Incidental finding of expanded empty sella which could be seen with idiopathic intracranial hypertension.   Review of Systems as detailed in HPI.  All others reviewed and are negative.   Objective: Vital Signs: There were no vitals taken for this visit.  Physical Exam well-developed well-nourished female no acute distress.  Alert and oriented x3.  Ortho Exam unchanged cervical spine exam  Specialty Comments:  No specialty comments available.  Imaging: No new imaging   PMFS History: There are no problems to display for this patient.  Past Medical History:  Diagnosis Date   Headache     Family History  Problem Relation Age of Onset   Breast cancer Maternal Aunt     History reviewed. No pertinent surgical history. Social History   Occupational History   Not on file  Tobacco Use   Smoking status: Never   Smokeless tobacco: Never  Vaping Use   Vaping Use: Never used  Substance and Sexual Activity   Alcohol use: Never   Drug use: Not Currently   Sexual activity: Yes

## 2021-03-13 ENCOUNTER — Ambulatory Visit: Payer: 59 | Admitting: Physical Medicine and Rehabilitation

## 2021-03-14 ENCOUNTER — Telehealth: Payer: Self-pay | Admitting: Neurology

## 2021-03-14 DIAGNOSIS — G43009 Migraine without aura, not intractable, without status migrainosus: Secondary | ICD-10-CM

## 2021-03-14 DIAGNOSIS — H4710 Unspecified papilledema: Secondary | ICD-10-CM

## 2021-03-14 NOTE — Telephone Encounter (Signed)
Pt called in stating she recently had a CT scan of her Neck and they found an expanded empty sella. She is wondering if that is something she should be seen for?

## 2021-03-15 NOTE — Telephone Encounter (Signed)
Usually that is of no clinical significance.  It potentially may suggest increased spinal fluid pressure in the head - she would need to see an eyedoctor to evaluate for evidence of increased pressure behind the eyes.    Pt wants to If we could give her a referral to the ophthalmologist?

## 2021-03-16 ENCOUNTER — Telehealth: Payer: Self-pay | Admitting: Neurology

## 2021-03-16 NOTE — Telephone Encounter (Signed)
Pt advised of referral to [facility].    Pt concerns with her headache coming everyday now. Pt states before on Effexor headache were controlled but now it's not helping.    Advised to go the referral for ophthalmology and I will send a note to [facility] in regards to the headaches.   Dr.Jaffe out of the office for the next week pt advised.

## 2021-03-16 NOTE — Telephone Encounter (Signed)
Pt is returning sheenas call regarding a referral

## 2021-03-16 NOTE — Telephone Encounter (Signed)
LMOvm referral added. Please call office back for referral info.

## 2021-03-16 NOTE — Telephone Encounter (Signed)
Yes - we can refer for headache and evaluate for papilledema.  Referral added

## 2021-03-19 ENCOUNTER — Encounter: Payer: Self-pay | Admitting: Physical Medicine and Rehabilitation

## 2021-03-20 ENCOUNTER — Telehealth: Payer: Self-pay

## 2021-03-20 DIAGNOSIS — H4710 Unspecified papilledema: Secondary | ICD-10-CM

## 2021-03-20 DIAGNOSIS — G43009 Migraine without aura, not intractable, without status migrainosus: Secondary | ICD-10-CM

## 2021-03-20 NOTE — Telephone Encounter (Signed)
Pt has to go to [facility]. In network with [facility].  Referral added

## 2021-03-21 ENCOUNTER — Ambulatory Visit (INDEPENDENT_AMBULATORY_CARE_PROVIDER_SITE_OTHER): Payer: 59 | Admitting: Physical Medicine and Rehabilitation

## 2021-03-21 ENCOUNTER — Ambulatory Visit: Payer: Self-pay

## 2021-03-21 ENCOUNTER — Encounter: Payer: Self-pay | Admitting: Physical Medicine and Rehabilitation

## 2021-03-21 ENCOUNTER — Other Ambulatory Visit: Payer: Self-pay

## 2021-03-21 VITALS — BP 100/69 | HR 76

## 2021-03-21 DIAGNOSIS — M5116 Intervertebral disc disorders with radiculopathy, lumbar region: Secondary | ICD-10-CM

## 2021-03-21 DIAGNOSIS — M5416 Radiculopathy, lumbar region: Secondary | ICD-10-CM | POA: Diagnosis not present

## 2021-03-21 DIAGNOSIS — M48061 Spinal stenosis, lumbar region without neurogenic claudication: Secondary | ICD-10-CM

## 2021-03-21 MED ORDER — BETAMETHASONE SOD PHOS & ACET 6 (3-3) MG/ML IJ SUSP
12.0000 mg | Freq: Once | INTRAMUSCULAR | Status: AC
  Administered 2021-03-21: 12 mg

## 2021-03-21 NOTE — Procedures (Signed)
Lumbar Epidural Steroid Injection - Interlaminar Approach with Fluoroscopic Guidance  Patient: Dawn Hicks      Date of Birth: [birth year] MRN: [id number] PCP: Deatra James, MD      Visit Date: 03/21/2021   Universal Protocol:     Consent Given By: the patient  Position: PRONE  Additional Comments: Vital signs were monitored before and after the procedure. Patient was prepped and draped in the usual sterile fashion. The correct patient, procedure, and site was verified.   Injection Procedure Details:   Procedure diagnoses: Lumbar radiculopathy [M54.16]   Meds Administered:  Meds ordered this encounter  Medications   betamethasone acetate-betamethasone sodium phosphate (CELESTONE) injection 12 mg     Laterality: Bilateral  Location/Site:  L5-S1  Needle: 3.5 in., 20 ga. Tuohy  Needle Placement: Paramedian epidural  Findings:   -Comments: Excellent flow of contrast into the epidural space.  Procedure Details: Using a paramedian approach from the side mentioned above, the region overlying the inferior lamina was localized under fluoroscopic visualization and the soft tissues overlying this structure were infiltrated with 4 ml. of 1% Lidocaine without Epinephrine. The Tuohy needle was inserted into the epidural space using a paramedian approach.   The epidural space was localized using loss of resistance along with counter oblique bi-planar fluoroscopic views.  After negative aspirate for air, blood, and CSF, a 2 ml. volume of Isovue-250 was injected into the epidural space and the flow of contrast was observed. Radiographs were obtained for documentation purposes.    The injectate was administered into the level noted above.   Additional Comments:  The patient tolerated the procedure well Dressing: 2 x 2 sterile gauze and Band-Aid    Post-procedure details: Patient was observed during the procedure. Post-procedure instructions were reviewed.  Patient left  the clinic in stable condition.

## 2021-03-21 NOTE — Patient Instructions (Signed)

## 2021-03-21 NOTE — Progress Notes (Signed)
Pt state lower back pain that travels to bot hips and down her legs. Pt state walking, laying and sitting makes the pain worse. Pt state doing house work makes the pain worse. Pt state she takes pain meds and heating/ ice to help ease her pain.  Numeric Pain Rating Scale and Functional Assessment Average Pain 6   In the last MONTH (on 0-10 scale) has pain interfered with the following?  1. General activity like being  able to carry out your everyday physical activities such as walking, climbing stairs, carrying groceries, or moving a chair?  Rating(10)   +Driver, -BT, -Dye Allergies.

## 2021-03-21 NOTE — Progress Notes (Signed)
Dawn Hicks - 45 y.o. female MRN [id number]  Date of birth: January 25, 1976  Office Visit Note: Visit Date: 03/21/2021 PCP: Deatra James, MD Referred by: Deatra James, MD  Subjective: Chief Complaint  Patient presents with   Lower Back - Pain   Right Hip - Pain   Left Hip - Pain   Left Leg - Pain   Right Leg - Pain   HPI:  Dawn Hicks is a 45 y.o. female who comes in today at the request of Dr. Glee Arvin for planned Bilateral L5-S1 Lumbar Interlaminar epidural steroid injection with fluoroscopic guidance.  The patient has failed conservative care including home exercise, medications, time and activity modification.  This injection will be diagnostic and hopefully therapeutic.  Please see requesting physician notes for further details and justification. MRI reviewed with images and spine model.  MRI reviewed in the note below.    ROS Otherwise per HPI.  Assessment & Plan: Visit Diagnoses:    ICD-10-CM   1. Lumbar radiculopathy  M54.16 XR C-ARM NO REPORT    Epidural Steroid injection    betamethasone acetate-betamethasone sodium phosphate (CELESTONE) injection 12 mg    2. Bilateral stenosis of lateral recess of lumbar spine  M48.061     3. Radiculopathy due to lumbar intervertebral disc disorder  M51.16       Plan: No additional findings.   Meds & Orders:  Meds ordered this encounter  Medications   betamethasone acetate-betamethasone sodium phosphate (CELESTONE) injection 12 mg    Orders Placed This Encounter  Procedures   XR C-ARM NO REPORT   Epidural Steroid injection    Follow-up: Return if symptoms worsen or fail to improve.   Procedures: No procedures performed  Lumbar Epidural Steroid Injection - Interlaminar Approach with Fluoroscopic Guidance  Patient: Dawn Hicks      Date of Birth: 11-04-1975 MRN: [id number] PCP: Deatra James, MD      Visit Date: 03/21/2021   Universal Protocol:     Consent Given By: the patient  Position:  PRONE  Additional Comments: Vital signs were monitored before and after the procedure. Patient was prepped and draped in the usual sterile fashion. The correct patient, procedure, and site was verified.   Injection Procedure Details:   Procedure diagnoses: Lumbar radiculopathy [M54.16]   Meds Administered:  Meds ordered this encounter  Medications   betamethasone acetate-betamethasone sodium phosphate (CELESTONE) injection 12 mg     Laterality: Bilateral  Location/Site:  L5-S1  Needle: 3.5 in., 20 ga. Tuohy  Needle Placement: Paramedian epidural  Findings:   -Comments: Excellent flow of contrast into the epidural space.  Procedure Details: Using a paramedian approach from the side mentioned above, the region overlying the inferior lamina was localized under fluoroscopic visualization and the soft tissues overlying this structure were infiltrated with 4 ml. of 1% Lidocaine without Epinephrine. The Tuohy needle was inserted into the epidural space using a paramedian approach.   The epidural space was localized using loss of resistance along with counter oblique bi-planar fluoroscopic views.  After negative aspirate for air, blood, and CSF, a 2 ml. volume of Isovue-250 was injected into the epidural space and the flow of contrast was observed. Radiographs were obtained for documentation purposes.    The injectate was administered into the level noted above.   Additional Comments:  The patient tolerated the procedure well Dressing: 2 x 2 sterile gauze and Band-Aid    Post-procedure details: Patient was observed during the procedure. Post-procedure instructions were  reviewed.  Patient left the clinic in stable condition.   Clinical History: MRI LUMBAR SPINE WITHOUT CONTRAST   TECHNIQUE: Multiplanar, multisequence MR imaging of the lumbar spine was performed. No intravenous contrast was administered.   COMPARISON:  None.   FINDINGS: Segmentation:  Standard.    Alignment:  Physiologic.   Vertebrae:  No fracture, evidence of discitis, or bone lesion.   Conus medullaris and cauda equina: Conus extends to the L1 level. Conus and cauda equina appear normal.   Paraspinal and other soft tissues: Negative.   Disc levels:   The T11-L4 levels are normal.   L4-5: Small central disc protrusion. No spinal canal or neural foraminal stenosis.   L5-S1: Small central disc protrusion. Mild bilateral lateral recess narrowing. No central spinal canal or neural foraminal stenosis.   IMPRESSION: 1. Mild bilateral lateral recess narrowing at L5-S1 due to small central disc protrusion. Correlate for S1 radiculopathy. 2. Small central disc protrusion at L4-L5 without associated stenosis.     Electronically Signed   By: Deatra Robinson M.D.   On: 02/14/2021 01:20     Objective:  VS:  HT:    WT:   BMI:     BP:100/69  HR:76bpm  TEMP: ( )  RESP:  Physical Exam Vitals and nursing note reviewed.  Constitutional:      General: She is not in acute distress.    Appearance: Normal appearance. She is not ill-appearing.  HENT:     Head: Normocephalic and atraumatic.     Right Ear: External ear normal.     Left Ear: External ear normal.  Eyes:     Extraocular Movements: Extraocular movements intact.  Cardiovascular:     Rate and Rhythm: Normal rate.     Pulses: Normal pulses.  Pulmonary:     Effort: Pulmonary effort is normal. No respiratory distress.  Abdominal:     General: There is no distension.     Palpations: Abdomen is soft.  Musculoskeletal:        General: Tenderness present.     Cervical back: Neck supple.     Right lower leg: No edema.     Left lower leg: No edema.     Comments: Patient has good distal strength with no pain over the greater trochanters.  No clonus or focal weakness.  Skin:    Findings: No erythema, lesion or rash.  Neurological:     General: No focal deficit present.     Mental Status: She is alert and oriented to  person, place, and time.     Sensory: No sensory deficit.     Motor: No weakness or abnormal muscle tone.     Coordination: Coordination normal.  Psychiatric:        Mood and Affect: Mood normal.        Behavior: Behavior normal.     Imaging: No results found.

## 2021-03-26 NOTE — Telephone Encounter (Signed)
Pt advised Per Dr.Jaffe to increase Venlafaxine to 150 mg and see how she do.   Pt has an appt with opthalmology tomorrow.

## 2021-03-27 ENCOUNTER — Telehealth: Payer: Self-pay | Admitting: Neurology

## 2021-03-27 NOTE — Telephone Encounter (Signed)
[facility] called in stating we referred the patient, but they did not get notes from [place] for her upcoming visit with them. Please fax the notes to [phone]

## 2021-03-27 NOTE — Telephone Encounter (Signed)
Notes faxed to [phone]

## 2021-04-24 ENCOUNTER — Other Ambulatory Visit: Payer: Self-pay | Admitting: Physician Assistant

## 2021-04-30 ENCOUNTER — Ambulatory Visit (INDEPENDENT_AMBULATORY_CARE_PROVIDER_SITE_OTHER): Payer: 59 | Admitting: Physical Medicine and Rehabilitation

## 2021-04-30 ENCOUNTER — Encounter: Payer: Self-pay | Admitting: Physical Medicine and Rehabilitation

## 2021-04-30 ENCOUNTER — Other Ambulatory Visit: Payer: Self-pay

## 2021-04-30 ENCOUNTER — Ambulatory Visit: Payer: Self-pay

## 2021-04-30 VITALS — BP 112/78 | HR 80

## 2021-04-30 DIAGNOSIS — M5412 Radiculopathy, cervical region: Secondary | ICD-10-CM

## 2021-04-30 MED ORDER — BETAMETHASONE SOD PHOS & ACET 6 (3-3) MG/ML IJ SUSP
12.0000 mg | Freq: Once | INTRAMUSCULAR | Status: AC
  Administered 2021-04-30: 12 mg

## 2021-04-30 NOTE — Patient Instructions (Signed)

## 2021-04-30 NOTE — Progress Notes (Signed)
Pt state neck pain that travels to her head. Pt state laying down or turning her head makes the pain worse. Pt state typing on the computer makes the pain worse. Pt state she takes pain meds to help ease her pain.  Numeric Pain Rating Scale and Functional Assessment Average Pain 6   In the last MONTH (on 0-10 scale) has pain interfered with the following?  1. General activity like being  able to carry out your everyday physical activities such as walking, climbing stairs, carrying groceries, or moving a chair?  Rating(10)   +Driver, -BT, -Dye Allergies.

## 2021-05-01 ENCOUNTER — Telehealth: Payer: Self-pay | Admitting: Physical Medicine and Rehabilitation

## 2021-05-01 NOTE — Telephone Encounter (Signed)
Patient called needing to reschedule her appointment. The number to contact patient is [phone]

## 2021-05-02 NOTE — Telephone Encounter (Signed)
Called patient and rescheduled appointment

## 2021-05-04 ENCOUNTER — Ambulatory Visit: Payer: 59 | Admitting: Neurology

## 2021-05-08 ENCOUNTER — Other Ambulatory Visit: Payer: Self-pay | Admitting: Neurology

## 2021-05-11 NOTE — Procedures (Signed)
Cervical Epidural Steroid Injection - Interlaminar Approach with Fluoroscopic Guidance  Patient: Dawn Hicks      Date of Birth: [birth year] MRN: [id number] PCP: Deatra James, MD      Visit Date: 04/30/2021   Universal Protocol:    Date/Time: 09/30/221:40 PM  Consent Given By: the patient  Position: PRONE  Additional Comments: Vital signs were monitored before and after the procedure. Patient was prepped and draped in the usual sterile fashion. The correct patient, procedure, and site was verified.   Injection Procedure Details:   Procedure diagnoses: Cervical radiculopathy [M54.12]    Meds Administered:  Meds ordered this encounter  Medications   betamethasone acetate-betamethasone sodium phosphate (CELESTONE) injection 12 mg     Laterality: Right  Location/Site: C7-T1  Needle: 3.5 in., 20 ga. Tuohy  Needle Placement: Paramedian epidural space  Findings:  -Comments: Excellent flow of contrast into the epidural space.  Procedure Details: Using a paramedian approach from the side mentioned above, the region overlying the inferior lamina was localized under fluoroscopic visualization and the soft tissues overlying this structure were infiltrated with 4 ml. of 1% Lidocaine without Epinephrine. A # 20 gauge, Tuohy needle was inserted into the epidural space using a paramedian approach.  The epidural space was localized using loss of resistance along with contralateral oblique bi-planar fluoroscopic views.  After negative aspirate for air, blood, and CSF, a 2 ml. volume of Isovue-250 was injected into the epidural space and the flow of contrast was observed. Radiographs were obtained for documentation purposes.   The injectate was administered into the level noted above.  Additional Comments:  The patient tolerated the procedure well Dressing: 2 x 2 sterile gauze and Band-Aid    Post-procedure details: Patient was observed during the procedure. Post-procedure  instructions were reviewed.  Patient left the clinic in stable condition.

## 2021-05-11 NOTE — Progress Notes (Signed)
Dawn Hicks - 45 y.o. female MRN [id number]  Date of birth: 1976-03-07  Office Visit Note: Visit Date: 04/30/2021 PCP: Deatra James, MD Referred by: Deatra James, MD  Subjective: Chief Complaint  Patient presents with   Neck - Pain   Head - Pain   HPI:  Dawn Hicks is a 45 y.o. female who comes in today at the request of Dr. Glee Arvin for planned Midline C7-T1 Cervical Interlaminar epidural steroid injection with fluoroscopic guidance.  The patient has failed conservative care including home exercise, medications, time and activity modification.  This injection will be diagnostic and hopefully therapeutic.  Please see requesting physician notes for further details and justification.   ROS Otherwise per HPI.  Assessment & Plan: Visit Diagnoses:    ICD-10-CM   1. Cervical radiculopathy  M54.12 XR C-ARM NO REPORT    Epidural Steroid injection    betamethasone acetate-betamethasone sodium phosphate (CELESTONE) injection 12 mg      Plan: No additional findings.   Meds & Orders:  Meds ordered this encounter  Medications   betamethasone acetate-betamethasone sodium phosphate (CELESTONE) injection 12 mg    Orders Placed This Encounter  Procedures   XR C-ARM NO REPORT   Epidural Steroid injection    Follow-up: Return if symptoms worsen or fail to improve.   Procedures: No procedures performed  Cervical Epidural Steroid Injection - Interlaminar Approach with Fluoroscopic Guidance  Patient: Dawn Hicks      Date of Birth: 28-Jan-1976 MRN: [id number] PCP: Deatra James, MD      Visit Date: 04/30/2021   Universal Protocol:    Date/Time: 09/30/221:40 PM  Consent Given By: the patient  Position: PRONE  Additional Comments: Vital signs were monitored before and after the procedure. Patient was prepped and draped in the usual sterile fashion. The correct patient, procedure, and site was verified.   Injection Procedure Details:   Procedure  diagnoses: Cervical radiculopathy [M54.12]    Meds Administered:  Meds ordered this encounter  Medications   betamethasone acetate-betamethasone sodium phosphate (CELESTONE) injection 12 mg     Laterality: Right  Location/Site: C7-T1  Needle: 3.5 in., 20 ga. Tuohy  Needle Placement: Paramedian epidural space  Findings:  -Comments: Excellent flow of contrast into the epidural space.  Procedure Details: Using a paramedian approach from the side mentioned above, the region overlying the inferior lamina was localized under fluoroscopic visualization and the soft tissues overlying this structure were infiltrated with 4 ml. of 1% Lidocaine without Epinephrine. A # 20 gauge, Tuohy needle was inserted into the epidural space using a paramedian approach.  The epidural space was localized using loss of resistance along with contralateral oblique bi-planar fluoroscopic views.  After negative aspirate for air, blood, and CSF, a 2 ml. volume of Isovue-250 was injected into the epidural space and the flow of contrast was observed. Radiographs were obtained for documentation purposes.   The injectate was administered into the level noted above.  Additional Comments:  The patient tolerated the procedure well Dressing: 2 x 2 sterile gauze and Band-Aid    Post-procedure details: Patient was observed during the procedure. Post-procedure instructions were reviewed.  Patient left the clinic in stable condition.   Clinical History: MRI LUMBAR SPINE WITHOUT CONTRAST   TECHNIQUE: Multiplanar, multisequence MR imaging of the lumbar spine was performed. No intravenous contrast was administered.   COMPARISON:  None.   FINDINGS: Segmentation:  Standard.   Alignment:  Physiologic.   Vertebrae:  No fracture, evidence of discitis,  or bone lesion.   Conus medullaris and cauda equina: Conus extends to the L1 level. Conus and cauda equina appear normal.   Paraspinal and other soft tissues:  Negative.   Disc levels:   The T11-L4 levels are normal.   L4-5: Small central disc protrusion. No spinal canal or neural foraminal stenosis.   L5-S1: Small central disc protrusion. Mild bilateral lateral recess narrowing. No central spinal canal or neural foraminal stenosis.   IMPRESSION: 1. Mild bilateral lateral recess narrowing at L5-S1 due to small central disc protrusion. Correlate for S1 radiculopathy. 2. Small central disc protrusion at L4-L5 without associated stenosis.     Electronically Signed   By: Deatra Robinson M.D.   On: 02/14/2021 01:20     Objective:  VS:  HT:    WT:   BMI:     BP:112/78  HR:80bpm  TEMP: ( )  RESP:  Physical Exam Vitals and nursing note reviewed.  Constitutional:      General: She is not in acute distress.    Appearance: Normal appearance. She is not ill-appearing.  HENT:     Head: Normocephalic and atraumatic.     Right Ear: External ear normal.     Left Ear: External ear normal.  Eyes:     Extraocular Movements: Extraocular movements intact.  Cardiovascular:     Rate and Rhythm: Normal rate.     Pulses: Normal pulses.  Musculoskeletal:     Cervical back: Tenderness present. No rigidity.     Right lower leg: No edema.     Left lower leg: No edema.     Comments: Patient has good strength in the upper extremities including 5 out of 5 strength in wrist extension long finger flexion and APB.  There is no atrophy of the hands intrinsically.  There is a negative Hoffmann's test.   Lymphadenopathy:     Cervical: No cervical adenopathy.  Skin:    Findings: No erythema, lesion or rash.  Neurological:     General: No focal deficit present.     Mental Status: She is alert and oriented to person, place, and time.     Sensory: No sensory deficit.     Motor: No weakness or abnormal muscle tone.     Coordination: Coordination normal.  Psychiatric:        Mood and Affect: Mood normal.        Behavior: Behavior normal.      Imaging: No results found.

## 2021-05-14 ENCOUNTER — Other Ambulatory Visit: Payer: Self-pay | Admitting: Physician Assistant

## 2021-05-22 ENCOUNTER — Ambulatory Visit (INDEPENDENT_AMBULATORY_CARE_PROVIDER_SITE_OTHER): Payer: 59 | Admitting: Physical Medicine and Rehabilitation

## 2021-05-22 ENCOUNTER — Ambulatory Visit: Payer: 59 | Admitting: Physical Medicine and Rehabilitation

## 2021-05-22 ENCOUNTER — Other Ambulatory Visit: Payer: Self-pay

## 2021-05-22 ENCOUNTER — Encounter: Payer: Self-pay | Admitting: Physical Medicine and Rehabilitation

## 2021-05-22 VITALS — BP 130/85 | HR 82

## 2021-05-22 DIAGNOSIS — M47816 Spondylosis without myelopathy or radiculopathy, lumbar region: Secondary | ICD-10-CM | POA: Diagnosis not present

## 2021-05-22 DIAGNOSIS — M545 Low back pain, unspecified: Secondary | ICD-10-CM | POA: Diagnosis not present

## 2021-05-22 DIAGNOSIS — M5412 Radiculopathy, cervical region: Secondary | ICD-10-CM

## 2021-05-22 DIAGNOSIS — M7918 Myalgia, other site: Secondary | ICD-10-CM

## 2021-05-22 DIAGNOSIS — M5416 Radiculopathy, lumbar region: Secondary | ICD-10-CM | POA: Diagnosis not present

## 2021-05-22 DIAGNOSIS — G8929 Other chronic pain: Secondary | ICD-10-CM

## 2021-05-22 MED ORDER — DICLOFENAC SODIUM 75 MG PO TBEC: 75.0000 mg | DELAYED_RELEASE_TABLET | Freq: Two times a day (BID) | ORAL | 0 refills | Status: DC | PRN

## 2021-05-22 NOTE — Progress Notes (Signed)
Pt state neck pain. Pt state turning her head, working at the computer and getting out of bed. Pt state she feels this uncomfortable feeling when turning her head when driving. Pt state she takes pain meds to help ease her pain. Pt has hx of inj on 04/30/21 pt state it helped but still have stiffness at times.  Numeric Pain Rating Scale and Functional Assessment Average Pain 8 Pain Right Now 6 My pain is intermittent and aching Pain is worse with: bending, standing, and some activites Pain improves with: medication   In the last MONTH (on 0-10 scale) has pain interfered with the following?  1. General activity like being  able to carry out your everyday physical activities such as walking, climbing stairs, carrying groceries, or moving a chair?  Rating(5)  2. Relation with others like being able to carry out your usual social activities and roles such as  activities at home, at work and in your community. Rating(6)  3. Enjoyment of life such that you have  been bothered by emotional problems such as feeling anxious, depressed or irritable?  Rating(7)

## 2021-05-22 NOTE — Progress Notes (Signed)
Dawn Hicks - 45 y.o. female MRN [id number]  Date of birth: 1976-07-29  Office Visit Note: Visit Date: 05/22/2021 PCP: Deatra James, MD Referred by: Deatra James, MD  Subjective: Chief Complaint  Patient presents with   Neck - Pain   HPI: Dawn Hicks is a 45 y.o. female who comes in today for evaluation of 2 ongoing issues. Chronic bilateral neck pain radiating to shoulders, right greater than left and bilateral lower back pain.  Patient had right C7-T1 cervical epidural steroid injection on 04/30/2021 which she reports has significantly helped to alleviate her neck pain.  Patient currently rates her bilateral neck pain as a 2 out of 10 and describes it as a soreness sensation. Patient's recent cervical MRI exhibits multilevel disc protrusions, no high-grade spinal canal stenosis noted. Patient states she is now able to move her neck without significant excruciating pain and is also able to work at a desk for longer periods of time.  Patient states she continues to perform stretching and strengthening exercises at home to help better manage her neck pain.  Patient states she does use a heating pad occasionally and continues to take Voltaren as needed.  Chronic worsening and severe bilateral lower back pain.  Patient had bilateral L5-S1 interlaminar epidural steroid injection on 03/21/2021.  Patient states that this injection did not help to alleviate her pain and reports that her pain has increased over the last several weeks.  Patient reports pain is exacerbated by walking, bending and prolonged standing.  Patient describes pain as a soreness and stiffness, currently rates as 8 out of 10.  Patient's recent lumbar MRI exhibits small central disc protrusion at L5-S1 which could be causing S1 radiculopathy, no high-grade spinal canal stenosis noted.  There is also moderate facet hypertrophy noted at the levels of L4-L5 and L5-S1 that is not notated on the lumbar MRI.  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  Patient did attend physical therapy at [facility] several months ago which she reports did help significantly to alleviate her lower back pain.  Patient denies focal weakness, numbness and tingling.  Patient denies recent trauma or falls.  Review of Systems  Neurological:  Negative for tingling, sensory change, focal weakness and weakness.  All other systems reviewed and are negative. Otherwise per HPI.  Assessment & Plan: Visit Diagnoses:    ICD-10-CM   1. Cervical radiculopathy  M54.12 Ambulatory referral to Physical Therapy    2. Lumbar radiculopathy  M54.16 Ambulatory referral to Physical Therapy       Plan: Findings:  Chronic bilateral neck pain radiating to shoulders, right greater than left that has significantly improved since recent right C7-T1 interlaminal epidural steroid injection.  Patient continues to perform stretching exercises and uses medications intermittently to manage neck discomfort.  Patient's Voltaren prescription was refilled today and can be used at home for moderate pain relief.  If patient's pain does return we would consider regrouping with physical therapy and possibly repeating cervical epidural steroid injection if warranted.  Chronic worsening and severe bilateral lower back pain.  Recent bilateral L5-S1 interlaminar epidural steroid injection did not provide her with significant pain relief and continues to have excruciating pain despite good conservative therapies.  We feel that the next step would be to place an order for in-house physical therapy as we are concerned  that patient's pain could be musculoskeletal in nature.  We did place an order for in-house physical therapy today with a focus on manual treatments and possible dry needling.  We will regroup with patient after several  sessions of physical therapy.  Patient's lumbar MRI does show moderate facet hypertrophy at the levels of L4-L5 and L5-S1, depending on how much pain relief she gets out of physical therapy we would also consider performing facet joint injections under fluoroscopic guidance.   Meds & Orders:  Meds ordered this encounter  Medications   diclofenac (VOLTAREN) 75 MG EC tablet    Sig: Take 1 tablet (75 mg total) by mouth 2 (two) times daily as needed for moderate pain.    Dispense:  60 tablet    Refill:  0    Order Specific Question:   Supervising Provider    Answer:   Tyrell Antonio [[id number]]    Orders Placed This Encounter  Procedures   Ambulatory referral to Physical Therapy    Follow-up: Return in about 1 month (around 06/22/2021) for follow-up after physical therapy for re-evaluation.   Procedures: No procedures performed      Clinical History: MRI LUMBAR SPINE WITHOUT CONTRAST   TECHNIQUE: Multiplanar, multisequence MR imaging of the lumbar spine was performed. No intravenous contrast was administered.   COMPARISON:  None.   FINDINGS: Segmentation:  Standard.   Alignment:  Physiologic.   Vertebrae:  No fracture, evidence of discitis, or bone lesion.   Conus medullaris and cauda equina: Conus extends to the L1 level. Conus and cauda equina appear normal.   Paraspinal and other soft tissues: Negative.   Disc levels:   The T11-L4 levels are normal.   L4-5: Small central disc protrusion. No spinal canal or neural foraminal stenosis.   L5-S1: Small central disc protrusion. Mild bilateral lateral recess narrowing. No central spinal canal or neural foraminal stenosis.   IMPRESSION: 1. Mild bilateral lateral recess narrowing at L5-S1 due to small central disc protrusion. Correlate for S1 radiculopathy. 2. Small central disc protrusion at L4-L5 without associated stenosis.     Electronically Signed   By: Deatra Robinson M.D.   On: 02/14/2021 01:20   She reports  that she has never smoked. She has never used smokeless tobacco. No results for input(s): HGBA1C, LABURIC in the last 8760 hours.  Objective:  VS:  HT:    WT:   BMI:     BP:130/85  HR:82bpm  TEMP: ( )  RESP:  Physical Exam Vitals and nursing note reviewed.  HENT:     Head: Normocephalic and atraumatic.     Right Ear: External ear normal.     Left Ear: External ear normal.     Nose: Nose normal.     Mouth/Throat:     Mouth: Mucous membranes are moist.  Eyes:     Extraocular Movements: Extraocular movements intact.  Cardiovascular:     Rate and Rhythm: Normal rate.     Pulses: Normal pulses.  Pulmonary:     Effort: Pulmonary effort is normal.  Abdominal:     General: Abdomen is flat. There is no distension.  Musculoskeletal:        General: Tenderness present.     Cervical back: Normal range of motion.     Comments: No discomfort noted with flexion, extension and side-to-side rotation. Good strength noted to bilateral upper extremities. Sensation intact bilaterally. Negative Hoffman's sign.    Pt rises from seated position to standing without difficulty. Pain noted  upon facet loading. Strong distal strength without clonus, no pain upon palpation of greater trochanters. Sensation intact bilaterally. Walks independently, gait steady.    Skin:    General: Skin is warm.     Capillary Refill: Capillary refill takes less than 2 seconds.  Neurological:     General: No focal deficit present.     Mental Status: She is alert and oriented to person, place, and time.  Psychiatric:        Mood and Affect: Mood normal.    Ortho Exam  Imaging: No results found.  Past Medical/Family/Surgical/Social History: Medications & Allergies reviewed per EMR, new medications updated. There are no problems to display for this patient.  Past Medical History:  Diagnosis Date   Headache    Family History  Problem Relation Age of Onset   Breast cancer Maternal Aunt    History reviewed. No  pertinent surgical history. Social History   Occupational History   Not on file  Tobacco Use   Smoking status: Never   Smokeless tobacco: Never  Vaping Use   Vaping Use: Never used  Substance and Sexual Activity   Alcohol use: Never   Drug use: Not Currently   Sexual activity: Yes

## 2021-06-05 ENCOUNTER — Ambulatory Visit: Payer: 59 | Admitting: Physical Therapy
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
## 2021-06-06 ENCOUNTER — Ambulatory Visit: Payer: 59 | Admitting: Psychiatry

## 2021-06-06 ENCOUNTER — Encounter: Payer: Self-pay | Admitting: Psychiatry

## 2021-06-06 VITALS — BP 114/81 | HR 81 | Ht 65.0 in | Wt 194.0 lb

## 2021-06-06 DIAGNOSIS — E236 Other disorders of pituitary gland: Secondary | ICD-10-CM | POA: Diagnosis not present

## 2021-06-06 DIAGNOSIS — R519 Headache, unspecified: Secondary | ICD-10-CM

## 2021-06-06 MED ORDER — ZONISAMIDE 100 MG PO CAPS: ORAL_CAPSULE | ORAL | 2 refills | Status: DC

## 2021-06-06 MED ORDER — SUMATRIPTAN 20 MG/ACT NA SOLN: 20.0000 mg | NASAL | 2 refills | Status: DC | PRN

## 2021-06-06 MED ORDER — ONDANSETRON HCL 8 MG PO TABS: 8.0000 mg | ORAL_TABLET | Freq: Three times a day (TID) | ORAL | 2 refills | Status: DC | PRN

## 2021-06-06 NOTE — Patient Instructions (Addendum)
Start Zonisamide 100 mg daily for two weeks, then if no side effects increase to 200 mg daily. Take zofran as needed in place of promethazine Take Imitrex nasal spray as needed for migraine MRI/MRA brain

## 2021-06-07 ENCOUNTER — Telehealth: Payer: Self-pay | Admitting: Psychiatry

## 2021-06-07 ENCOUNTER — Telehealth: Payer: Self-pay

## 2021-06-07 NOTE — Telephone Encounter (Signed)
PA for sumatriptan 20 mg/ACT solution has been submitted through cover my meds.  (Key: [id number])  [employer] is reviewing your PA request. You may close this dialog, return to your dashboard, and perform other tasks.  To check for an update later, open this request again from your dashboard. If [employer] has not replied within 24 hours for urgent requests or within 48 hours for standard requests, please contact [employer] at [phone].

## 2021-06-07 NOTE — Telephone Encounter (Signed)
[facility]: [id number] (exp. 06/07/21 to 07/06/21) order sent to GI, they will reach out to the patient to schedule.

## 2021-06-11 ENCOUNTER — Other Ambulatory Visit: Payer: Self-pay | Admitting: Psychiatry

## 2021-06-11 MED ORDER — SUMATRIPTAN SUCCINATE 100 MG PO TABS: 100.0000 mg | ORAL_TABLET | ORAL | 3 refills | Status: DC | PRN

## 2021-06-11 NOTE — Telephone Encounter (Signed)
Rx for oral sumatriptan sent to her pharmacy, thanks

## 2021-06-11 NOTE — Telephone Encounter (Signed)
I called the pt and advised of the denial and Dr. Quentin Mulling recommendation. Pt verbalized understanding and agreement to the plan. She will let us know if she has any issues picking the medication up.

## 2021-06-11 NOTE — Telephone Encounter (Addendum)
Received PA denial for Sumatriptan 20 mg. Per pt's [facility] policy, pt must try and fail oral sumatriptan, rizatriptan, naratriptan, and almotriptan. Will fwd information to MD for review.

## 2021-06-12 ENCOUNTER — Ambulatory Visit: Payer: 59 | Admitting: Psychiatry

## 2021-06-13 ENCOUNTER — Other Ambulatory Visit: Payer: Self-pay

## 2021-06-13 ENCOUNTER — Encounter: Payer: Self-pay | Admitting: Rehabilitative and Restorative Service Providers"

## 2021-06-13 ENCOUNTER — Ambulatory Visit (INDEPENDENT_AMBULATORY_CARE_PROVIDER_SITE_OTHER): Payer: 59 | Admitting: Rehabilitative and Restorative Service Providers"

## 2021-06-13 DIAGNOSIS — M542 Cervicalgia: Secondary | ICD-10-CM | POA: Diagnosis not present

## 2021-06-13 DIAGNOSIS — M5441 Lumbago with sciatica, right side: Secondary | ICD-10-CM | POA: Diagnosis not present

## 2021-06-13 DIAGNOSIS — M6281 Muscle weakness (generalized): Secondary | ICD-10-CM

## 2021-06-13 DIAGNOSIS — G8929 Other chronic pain: Secondary | ICD-10-CM

## 2021-06-13 DIAGNOSIS — R262 Difficulty in walking, not elsewhere classified: Secondary | ICD-10-CM

## 2021-06-13 DIAGNOSIS — R293 Abnormal posture: Secondary | ICD-10-CM | POA: Diagnosis not present

## 2021-06-13 NOTE — Patient Instructions (Signed)
Access Code: [facility] URL: [email or web address] Date: 06/13/2021 Prepared by: Pauletta Browns  Exercises Standing Scapular Retraction - 5 x daily - 7 x weekly - 1 sets - 5 reps - 5 second hold Seated Cervical Rotation AROM - 3-5 x daily - 7 x weekly - 1 sets - 10 reps - 5 seconds hold Standing Lumbar Extension at Wall - Forearms - 5 x daily - 7 x weekly - 1 sets - 5 reps - 3 seconds hold Standing Isometric Cervical Extension with Manual Resistance - 5 x daily - 7 x weekly - 1 sets - 5 reps - 5 hold
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
## 2021-06-18 ENCOUNTER — Encounter: Payer: Self-pay | Admitting: Physical Therapy

## 2021-06-18 ENCOUNTER — Telehealth: Payer: Self-pay | Admitting: Orthopaedic Surgery

## 2021-06-18 ENCOUNTER — Ambulatory Visit (INDEPENDENT_AMBULATORY_CARE_PROVIDER_SITE_OTHER): Payer: 59 | Admitting: Physical Therapy

## 2021-06-18 ENCOUNTER — Other Ambulatory Visit: Payer: Self-pay

## 2021-06-18 DIAGNOSIS — R262 Difficulty in walking, not elsewhere classified: Secondary | ICD-10-CM | POA: Diagnosis not present

## 2021-06-18 DIAGNOSIS — M6281 Muscle weakness (generalized): Secondary | ICD-10-CM

## 2021-06-18 DIAGNOSIS — R293 Abnormal posture: Secondary | ICD-10-CM

## 2021-06-18 DIAGNOSIS — M5441 Lumbago with sciatica, right side: Secondary | ICD-10-CM

## 2021-06-18 DIAGNOSIS — M542 Cervicalgia: Secondary | ICD-10-CM | POA: Diagnosis not present

## 2021-06-18 DIAGNOSIS — G8929 Other chronic pain: Secondary | ICD-10-CM

## 2021-06-18 NOTE — Telephone Encounter (Signed)
Sure wed morning

## 2021-06-18 NOTE — Telephone Encounter (Signed)
Patient is currently under care for her left knee. She is now having right knee pain. 

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
## 2021-06-18 NOTE — Telephone Encounter (Signed)
APPT MADE

## 2021-06-18 NOTE — Patient Instructions (Signed)
Access Code: [facility] URL: [email or web address] Date: 06/18/2021 Prepared by: Raynelle Fanning  Exercises Standing Scapular Retraction - 5 x daily - 7 x weekly - 1 sets - 5 reps - 5 second hold Seated Cervical Rotation AROM - 3-5 x daily - 7 x weekly - 1 sets - 10 reps - 5 seconds hold Standing Lumbar Extension at Wall - Forearms - 5 x daily - 7 x weekly - 1 sets - 5 reps - 3 seconds hold Standing Isometric Cervical Extension with Manual Resistance - 5 x daily - 7 x weekly - 1 sets - 5 reps - 5 hold Active Hip Flexor Stretch - 2 x daily - 7 x weekly - 1 sets - 10 reps Prone on Elbows Reach - 1 x daily - 7 x weekly - 2 sets - 10 reps

## 2021-06-19 ENCOUNTER — Ambulatory Visit: Payer: 59

## 2021-06-20 ENCOUNTER — Ambulatory Visit (INDEPENDENT_AMBULATORY_CARE_PROVIDER_SITE_OTHER): Payer: 59

## 2021-06-20 ENCOUNTER — Ambulatory Visit (INDEPENDENT_AMBULATORY_CARE_PROVIDER_SITE_OTHER): Payer: 59 | Admitting: Rehabilitative and Restorative Service Providers"

## 2021-06-20 ENCOUNTER — Ambulatory Visit (INDEPENDENT_AMBULATORY_CARE_PROVIDER_SITE_OTHER): Payer: 59 | Admitting: Orthopaedic Surgery

## 2021-06-20 ENCOUNTER — Other Ambulatory Visit: Payer: Self-pay

## 2021-06-20 ENCOUNTER — Encounter: Payer: Self-pay | Admitting: Rehabilitative and Restorative Service Providers"

## 2021-06-20 ENCOUNTER — Encounter: Payer: Self-pay | Admitting: Orthopaedic Surgery

## 2021-06-20 DIAGNOSIS — M6281 Muscle weakness (generalized): Secondary | ICD-10-CM

## 2021-06-20 DIAGNOSIS — M542 Cervicalgia: Secondary | ICD-10-CM | POA: Diagnosis not present

## 2021-06-20 DIAGNOSIS — R293 Abnormal posture: Secondary | ICD-10-CM | POA: Diagnosis not present

## 2021-06-20 DIAGNOSIS — G8929 Other chronic pain: Secondary | ICD-10-CM

## 2021-06-20 DIAGNOSIS — M25561 Pain in right knee: Secondary | ICD-10-CM | POA: Diagnosis not present

## 2021-06-20 DIAGNOSIS — M5441 Lumbago with sciatica, right side: Secondary | ICD-10-CM

## 2021-06-20 DIAGNOSIS — R262 Difficulty in walking, not elsewhere classified: Secondary | ICD-10-CM

## 2021-06-20 MED ORDER — METHYLPREDNISOLONE ACETATE 40 MG/ML IJ SUSP
40.0000 mg | INTRAMUSCULAR | Status: AC | PRN
Start: 2021-06-20 — End: 2021-06-20
  Administered 2021-06-20: 40 mg via INTRA_ARTICULAR

## 2021-06-20 MED ORDER — BUPIVACAINE HCL 0.5 % IJ SOLN
2.0000 mL | INTRAMUSCULAR | Status: AC | PRN
  Administered 2021-06-20: 2 mL via INTRA_ARTICULAR

## 2021-06-20 MED ORDER — LIDOCAINE HCL 1 % IJ SOLN
2.0000 mL | INTRAMUSCULAR | Status: AC | PRN
Start: 2021-06-20 — End: 2021-06-20
  Administered 2021-06-20: 2 mL

## 2021-06-20 NOTE — Progress Notes (Signed)
Office Visit Note   Patient: Dawn Hicks           Date of Birth: 05-12-1976           MRN: [id number] Visit Date: 06/20/2021              Requested by: Wilfrid Lund, PA [address] [place],  [place] [address] PCP: Wilfrid Lund, [place]   Assessment & Plan: Visit Diagnoses:  1. Chronic pain of right knee     Plan: Impression as chronic right knee pain.  Since she did so well from the left knee injection she requested to get 1 in the right knee.  Hopefully she will get the same relief as the left knee.  Overall findings are consistent with arthritis doubt there is a structural problem.  Follow-Up Instructions: No follow-ups on file.   Orders:  Orders Placed This Encounter  Procedures   XR KNEE 3 VIEW RIGHT   No orders of the defined types were placed in this encounter.     Procedures: Large Joint Inj: R knee on 06/20/2021 4:01 PM Indications: pain Details: 22 G needle  Arthrogram: No  Medications: 40 mg methylPREDNISolone acetate 40 MG/ML; 2 mL lidocaine 1 %; 2 mL bupivacaine 0.5 % Consent was given by the patient. Patient was prepped and draped in the usual sterile fashion.      Clinical Data: No additional findings.   Subjective: Chief Complaint  Patient presents with   Right Knee - Follow-up    Dawn Hicks is a 45 year old female here for evaluation of right knee pain and aching.  Denies any injuries or trauma or previous surgeries to the knee.  She is been doing physical therapy on her left knee and she feels that she may have overdone it in general.  We did see her recently and gave her cortisone injection in the left knee which has done really well.  She is very active overall but is currently limited by the right knee.  Denies any mechanical symptoms.   Review of Systems  Constitutional: Negative.   HENT: Negative.    Eyes: Negative.   Respiratory: Negative.    Cardiovascular: Negative.   Endocrine: Negative.   Musculoskeletal:  Negative.   Neurological: Negative.   Hematological: Negative.   Psychiatric/Behavioral: Negative.    All other systems reviewed and are negative.   Objective: Vital Signs: There were no vitals taken for this visit.  Physical Exam Vitals and nursing note reviewed.  Constitutional:      Appearance: She is well-developed.  Pulmonary:     Effort: Pulmonary effort is normal.  Skin:    General: Skin is warm.     Capillary Refill: Capillary refill takes less than 2 seconds.  Neurological:     Mental Status: She is alert and oriented to person, place, and time.  Psychiatric:        Behavior: Behavior normal.        Thought Content: Thought content normal.        Judgment: Judgment normal.    Ortho Exam  Right knee shows no effusion.  1+ crepitus with range of motion.  No restriction range of motion.  Collaterals cruciates are stable.  No joint line tenderness.  Specialty Comments:  No specialty comments available.  Imaging: No results found.   PMFS History: There are no problems to display for this patient.  Past Medical History:  Diagnosis Date   Headache  Family History  Problem Relation Age of Onset   Breast cancer Maternal Aunt     History reviewed. No pertinent surgical history. Social History   Occupational History   Not on file  Tobacco Use   Smoking status: Never   Smokeless tobacco: Never  Vaping Use   Vaping Use: Never used  Substance and Sexual Activity   Alcohol use: Never   Drug use: Not Currently   Sexual activity: Yes
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
## 2021-06-20 NOTE — Patient Instructions (Signed)
Access Code: [facility] URL: [email or web address] Date: 06/20/2021 Prepared by: Pauletta Browns  Exercises Standing Scapular Retraction - 5 x daily - 7 x weekly - 1 sets - 5 reps - 5 second hold Seated Cervical Rotation AROM - 3-5 x daily - 7 x weekly - 1 sets - 10 reps - 5 seconds hold Standing Lumbar Extension at Wall - Forearms - 5 x daily - 7 x weekly - 1 sets - 5 reps - 3 seconds hold Standing Isometric Cervical Extension with Manual Resistance - 5 x daily - 7 x weekly - 1 sets - 5 reps - 5 hold Supine Gluteus Stretch - 2 x daily - 7 x weekly - 1 sets - 5 reps - 20 seconds hold Prone Hip Extension - 2 x daily - 7 x weekly - 1 sets - 10 reps - 3 seconds hold Prone Alternating Arm and Leg Lifts - 2 x daily - 7 x weekly - 1 sets - 10 reps - 3-10 seconds hold

## 2021-06-21 ENCOUNTER — Other Ambulatory Visit: Payer: 59

## 2021-06-21 ENCOUNTER — Ambulatory Visit
Admission: RE | Admit: 2021-06-21 | Discharge: 2021-06-21 | Disposition: A | Discharge status: Home / Self Care | Payer: 59 | Source: Ambulatory Visit | Attending: Psychiatry | Admitting: Psychiatry

## 2021-06-21 DIAGNOSIS — R519 Headache, unspecified: Secondary | ICD-10-CM

## 2021-06-21 MED ORDER — GADOBENATE DIMEGLUMINE 529 MG/ML IV SOLN
18.0000 mL | Freq: Once | INTRAVENOUS | Status: AC | PRN
  Administered 2021-06-21: 18 mL via INTRAVENOUS

## 2021-07-03 ENCOUNTER — Encounter: Payer: Self-pay | Admitting: Rehabilitative and Restorative Service Providers"

## 2021-07-03 ENCOUNTER — Other Ambulatory Visit: Payer: Self-pay

## 2021-07-03 ENCOUNTER — Ambulatory Visit (INDEPENDENT_AMBULATORY_CARE_PROVIDER_SITE_OTHER): Payer: 59 | Admitting: Rehabilitative and Restorative Service Providers"

## 2021-07-03 DIAGNOSIS — G8929 Other chronic pain: Secondary | ICD-10-CM

## 2021-07-03 DIAGNOSIS — M542 Cervicalgia: Secondary | ICD-10-CM | POA: Diagnosis not present

## 2021-07-03 DIAGNOSIS — R262 Difficulty in walking, not elsewhere classified: Secondary | ICD-10-CM

## 2021-07-03 DIAGNOSIS — M5441 Lumbago with sciatica, right side: Secondary | ICD-10-CM

## 2021-07-03 DIAGNOSIS — R293 Abnormal posture: Secondary | ICD-10-CM

## 2021-07-03 DIAGNOSIS — M6281 Muscle weakness (generalized): Secondary | ICD-10-CM

## 2021-07-03 NOTE — Patient Instructions (Signed)
Access Code: [facility] URL: [email or web address] Date: 07/03/2021 Prepared by: Pauletta Browns  Exercises Standing Scapular Retraction - 5 x daily - 7 x weekly - 1 sets - 5 reps - 5 second hold Seated Cervical Rotation AROM - 3-5 x daily - 7 x weekly - 1 sets - 10 reps - 5 seconds hold Standing Lumbar Extension at Wall - Forearms - 5 x daily - 7 x weekly - 1 sets - 5 reps - 3 seconds hold Standing Isometric Cervical Extension with Manual Resistance - 3-5 x daily - 7 x weekly - 1 sets - 5 reps - 5 hold Supine Gluteus Stretch - 2 x daily - 7 x weekly - 1 sets - 5 reps - 20 seconds hold Prone Hip Extension - 1-2 x daily - 7 x weekly - 1 sets - 10 reps - 3 seconds hold Prone Alternating Arm and Leg Lifts - 1-2 x daily - 7 x weekly - 1 sets - 10 reps - 3-10 seconds hold
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
## 2021-07-11 ENCOUNTER — Encounter: Payer: 59 | Admitting: Rehabilitative and Restorative Service Providers"

## 2021-07-25 ENCOUNTER — Ambulatory Visit (INDEPENDENT_AMBULATORY_CARE_PROVIDER_SITE_OTHER): Payer: 59 | Admitting: Rehabilitative and Restorative Service Providers"

## 2021-07-25 ENCOUNTER — Other Ambulatory Visit: Payer: Self-pay

## 2021-07-25 ENCOUNTER — Encounter: Payer: Self-pay | Admitting: Rehabilitative and Restorative Service Providers"

## 2021-07-25 DIAGNOSIS — M542 Cervicalgia: Secondary | ICD-10-CM

## 2021-07-25 DIAGNOSIS — R262 Difficulty in walking, not elsewhere classified: Secondary | ICD-10-CM | POA: Diagnosis not present

## 2021-07-25 DIAGNOSIS — R293 Abnormal posture: Secondary | ICD-10-CM | POA: Diagnosis not present

## 2021-07-25 DIAGNOSIS — M6281 Muscle weakness (generalized): Secondary | ICD-10-CM

## 2021-07-25 NOTE — Patient Instructions (Signed)
Continue postural strengthening (SBP, cervical isometrics and prone strengthening) daily with other stretches PRN.  3X/week walking for 20 minutes encouraged as well.
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
## 2021-07-31 ENCOUNTER — Other Ambulatory Visit: Payer: Self-pay

## 2021-07-31 ENCOUNTER — Ambulatory Visit (INDEPENDENT_AMBULATORY_CARE_PROVIDER_SITE_OTHER): Payer: 59 | Admitting: Physical Therapy

## 2021-07-31 ENCOUNTER — Encounter: Payer: Self-pay | Admitting: Physical Therapy

## 2021-07-31 DIAGNOSIS — R293 Abnormal posture: Secondary | ICD-10-CM

## 2021-07-31 DIAGNOSIS — M542 Cervicalgia: Secondary | ICD-10-CM | POA: Diagnosis not present

## 2021-07-31 DIAGNOSIS — M5441 Lumbago with sciatica, right side: Secondary | ICD-10-CM

## 2021-07-31 DIAGNOSIS — M6281 Muscle weakness (generalized): Secondary | ICD-10-CM | POA: Diagnosis not present

## 2021-07-31 DIAGNOSIS — G8929 Other chronic pain: Secondary | ICD-10-CM

## 2021-07-31 DIAGNOSIS — M545 Low back pain, unspecified: Secondary | ICD-10-CM

## 2021-07-31 DIAGNOSIS — R262 Difficulty in walking, not elsewhere classified: Secondary | ICD-10-CM

## 2021-07-31 NOTE — Patient Instructions (Signed)
Access Code: [facility] URL: [email or web address] Date: 07/31/2021 Prepared by: Narda Amber  Exercises Standing Scapular Retraction - 5 x daily - 7 x weekly - 1 sets - 5 reps - 5 second hold Seated Cervical Rotation AROM - 3-5 x daily - 7 x weekly - 1 sets - 10 reps - 5 seconds hold Standing Lumbar Extension at Wall - Forearms - 5 x daily - 7 x weekly - 1 sets - 5 reps - 3 seconds hold Standing Isometric Cervical Extension with Manual Resistance - 3-5 x daily - 7 x weekly - 1 sets - 5 reps - 5 hold Supine Gluteus Stretch - 2 x daily - 7 x weekly - 1 sets - 5 reps - 20 seconds hold Prone Hip Extension - 1-2 x daily - 7 x weekly - 1 sets - 10 reps - 5 seconds hold Prone Alternating Arm and Leg Lifts - 1-2 x daily - 7 x weekly - 1 sets - 10 reps - 3-10 seconds hold Seated Piriformis Stretch - 1 x daily - 7 x weekly - 3 reps - 20 seconds hold Seated Piriformis Stretch - 1 x daily - 7 x weekly - 3 reps - 20 seconds hold Cat Cow - 1 x daily - 7 x weekly - 3 reps - 5-10 seconds hold Child's Pose with Sidebending - 1 x daily - 7 x weekly - 2 reps - 10 seconds hold
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
## 2021-09-06 ENCOUNTER — Encounter: Payer: Self-pay | Admitting: Psychiatry

## 2021-09-06 ENCOUNTER — Ambulatory Visit: Payer: Self-pay | Admitting: Psychiatry

## 2021-09-06 VITALS — BP 124/74 | HR 68 | Ht 65.0 in | Wt 198.6 lb

## 2021-09-06 DIAGNOSIS — G43109 Migraine with aura, not intractable, without status migrainosus: Secondary | ICD-10-CM

## 2021-09-06 MED ORDER — ZOLMITRIPTAN 5 MG PO TBDP: 5.0000 mg | ORAL_TABLET | ORAL | 3 refills | Status: DC | PRN

## 2021-09-06 NOTE — Progress Notes (Signed)
° °  CC:  headaches  Follow-up Visit  Last visit: 06/06/21  Brief HPI: 46 year old female who follows in clinic for headaches.  At her last visit, MRI/MRA were ordered. She was started on Zonisamide for prevention and phenergan for nausea. Imitrex nasal spray was continued for rescue.  Interval History: Headaches have slightly improved since starting Zonisamide. She has been taking 200 mg daily for 3 weeks now.  Had a difficulty time weaning off of Effexor due to withdrawal side effects. She felt withdrawal symptoms until the end of December. Was not taking this or Zonisamide for a while and headaches felt significantly worse at that time.  Imitrex nasal spray was denied so she was started on oral Imitrex. This helps but makes her too drowsy during the day.  Physical therapy has been helpful for her neck pain.  MRA was normal. MRI showed a partially empty sella. Ophthalmology exam did not reveal papilledema.  Current Headache Regimen: Preventative: Zonisamide 200 mg daily Abortive: Imitrex 100 mg PRN  Prior Therapies                                  Effexor - weight gain, significant withdrawal symptoms Topamax 100 mg BID - brain fog Zonisamide 200 mg daily Sumatriptan Promethazine Occipital nerve block - helped  Physical Exam:   Vital Signs: BP 124/74    Pulse 68    Ht 5\' 5"  (1.651 m)    Wt 198 lb 9.6 oz (90.1 kg)    BMI 33.05 kg/m  GENERAL:  well appearing, in no acute distress, alert  SKIN:  Color, texture, turgor normal. No rashes or lesions HEAD:  Normocephalic/atraumatic. RESP: normal respiratory effort MSK:  No gross joint deformities.   NEUROLOGICAL: Mental Status: Alert, oriented to person, place and time, Follows commands, and Speech fluent and appropriate. Cranial Nerves: PERRL, face symmetric, no dysarthria, hearing grossly intact Motor: moves all extremities equally Gait: normal-based.  IMPRESSION: 46 year old female who follows in clinic for chronic  migraines. She has started to see some improvement on Zonisamide. Will continue until she has been on this medication for 6-8 weeks, then reassess. Discussed CGRP injections as a next step if headaches do not improve. Will switch Imitrex to Zomig to see if this is better tolerated.  PLAN: -Prevention: Continue Zonisamide 200 mg daily -Rescue: Stop Imitrex. Start Zomig 5 mg PRN -next steps: consider CGRP, botox   Follow-up: 3 months  I spent a total of 32 minutes on the date of the service. Headache education was done. Discussed treatment options including preventive and acute medications, and natural supplements. Discussed medication side effects, adverse reactions and drug interactions. Written educational materials and patient instructions outlining all of the above were given.  Genia Harold, MD 09/06/21 4:02 PM

## 2021-09-06 NOTE — Patient Instructions (Addendum)
Continue Zonisamide, let me know if headaches do not improve after 8 weeks at current dose  Start Zomig as needed for migraine. Take at the onset of migraine. If headache recurs or does not fully resolve, you may take a second dose after 2 hours. Please avoid taking more than 2 days per week. Do not take Imitrex while taking this medication  Magnesium: Magnesium (250 mg twice a day or 500 mg at bed) has a relaxant effect on smooth muscles such as blood vessels. Individuals suffering from frequent or daily headache usually have low magnesium levels which can be increase with daily supplementation of 400-750 mg. Three trials found 40-90% average headache reduction  when used as a preventative. Magnesium also demonstrated the benefit in menstrually related migraine.  Magnesium is part of the messenger system in the serotonin cascade and it is a good muscle relaxant.  It is also useful for constipation which can be a side effect of other medications used to treat migraine. Good sources include nuts, whole grains, and tomatoes. Side Effects: loose stool/diarrhea

## 2021-09-26 ENCOUNTER — Other Ambulatory Visit: Payer: Self-pay | Admitting: Family Medicine

## 2021-09-26 DIAGNOSIS — N6310 Unspecified lump in the right breast, unspecified quadrant: Secondary | ICD-10-CM

## 2021-10-08 ENCOUNTER — Other Ambulatory Visit (HOSPITAL_COMMUNITY): Payer: Self-pay | Admitting: Family Medicine

## 2021-10-11 ENCOUNTER — Other Ambulatory Visit (HOSPITAL_COMMUNITY): Payer: Self-pay | Admitting: Family Medicine

## 2021-10-11 DIAGNOSIS — Z1231 Encounter for screening mammogram for malignant neoplasm of breast: Secondary | ICD-10-CM

## 2021-10-16 ENCOUNTER — Other Ambulatory Visit (HOSPITAL_COMMUNITY): Payer: Self-pay | Admitting: Family Medicine

## 2021-10-16 DIAGNOSIS — N6320 Unspecified lump in the left breast, unspecified quadrant: Secondary | ICD-10-CM

## 2021-10-16 DIAGNOSIS — N6310 Unspecified lump in the right breast, unspecified quadrant: Secondary | ICD-10-CM

## 2021-11-01 ENCOUNTER — Ambulatory Visit (HOSPITAL_COMMUNITY)
Admission: RE | Admit: 2021-11-01 | Discharge: 2021-11-01 | Disposition: A | Discharge status: Home / Self Care | Payer: 59 | Source: Ambulatory Visit | Attending: Family Medicine | Admitting: Family Medicine

## 2021-11-01 ENCOUNTER — Other Ambulatory Visit: Payer: Self-pay

## 2021-11-01 ENCOUNTER — Encounter (HOSPITAL_COMMUNITY): Payer: Self-pay

## 2021-11-01 DIAGNOSIS — N6310 Unspecified lump in the right breast, unspecified quadrant: Secondary | ICD-10-CM | POA: Diagnosis present

## 2021-11-01 DIAGNOSIS — N6320 Unspecified lump in the left breast, unspecified quadrant: Secondary | ICD-10-CM | POA: Diagnosis present

## 2021-11-01 IMAGING — MG DIGITAL DIAGNOSTIC BILAT W/ TOMO W/ CAD
8 series · 8 of 24 positions shown · non-contrast
Comparison: Previous exams.

CLINICAL DATA: Follow-up for probably benign right breast mass.

EXAM:
DIGITAL DIAGNOSTIC BILATERAL MAMMOGRAM WITH TOMOSYNTHESIS AND CAD;
ULTRASOUND RIGHT BREAST LIMITED
TECHNIQUE: Bilateral digital diagnostic mammography and breast tomosynthesis
was performed. The images were evaluated with computer-aided
detection.; Targeted ultrasound examination of the right breast was
performed

[L MLO synth-2D]
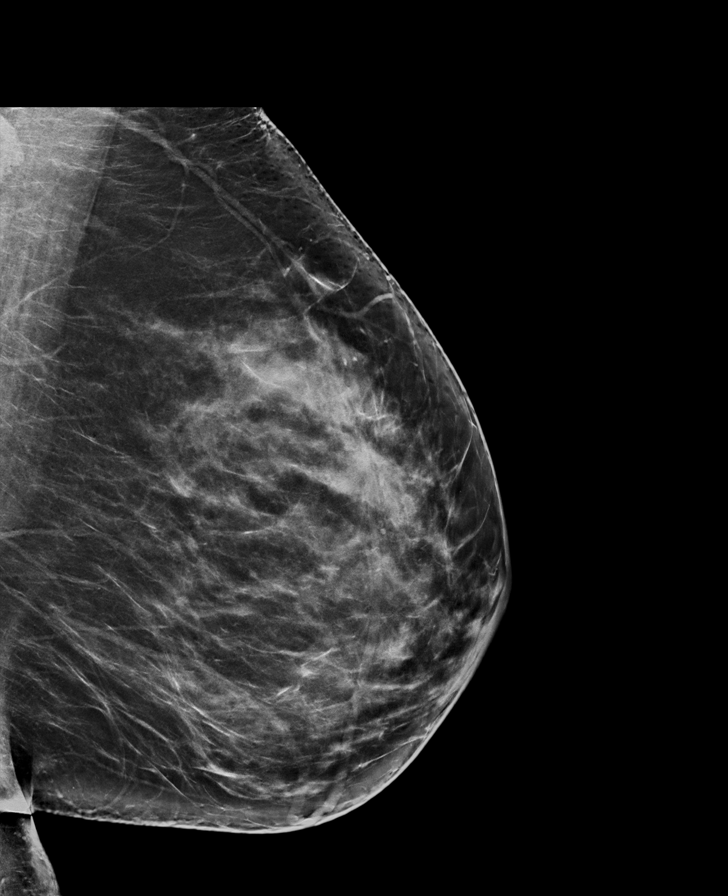

[R MLO synth-2D]
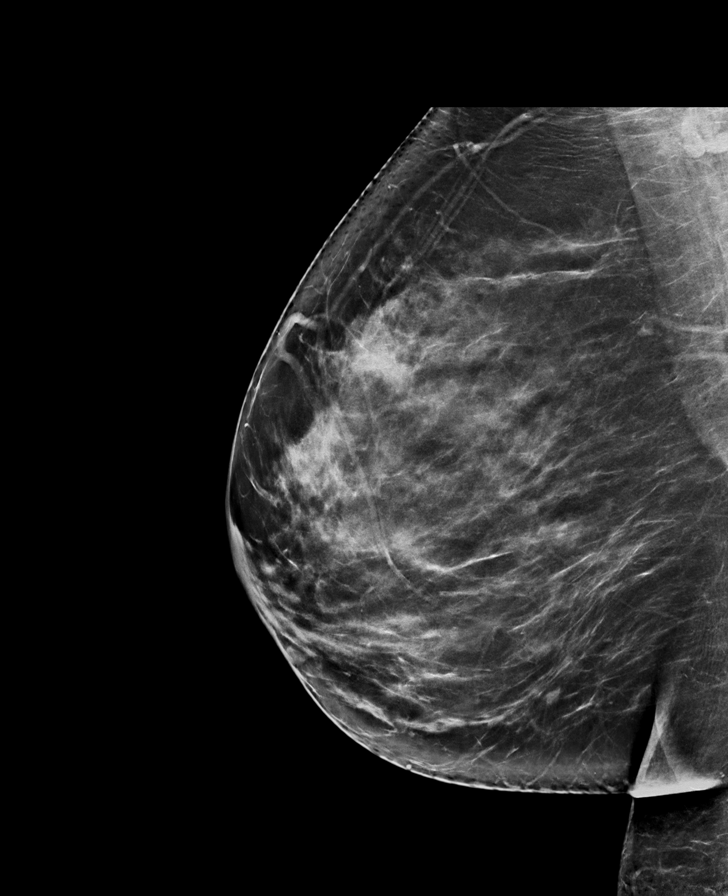

[R CC synth-2D]
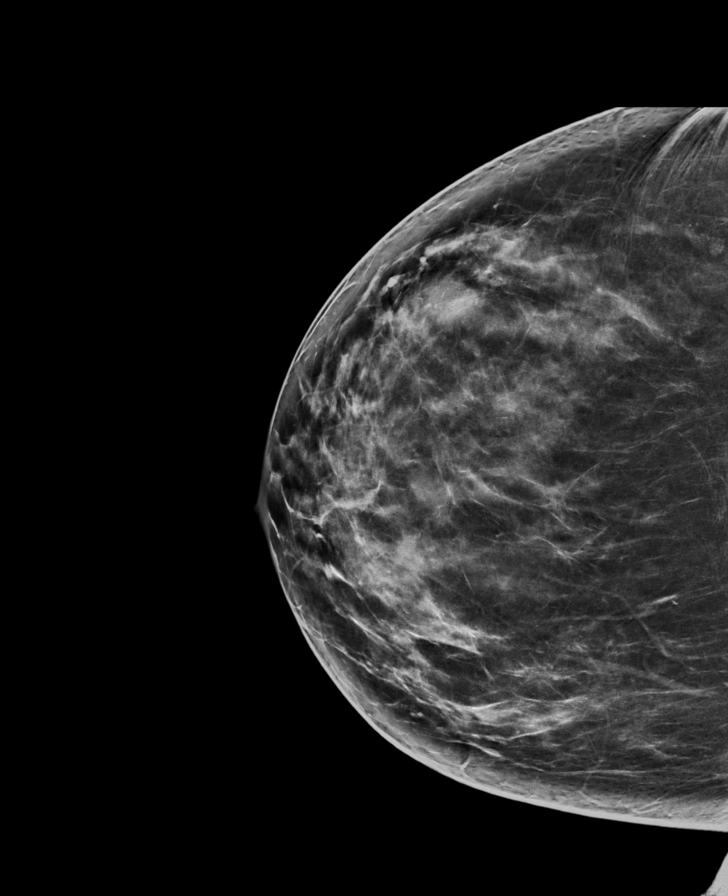

[L CC synth-2D]
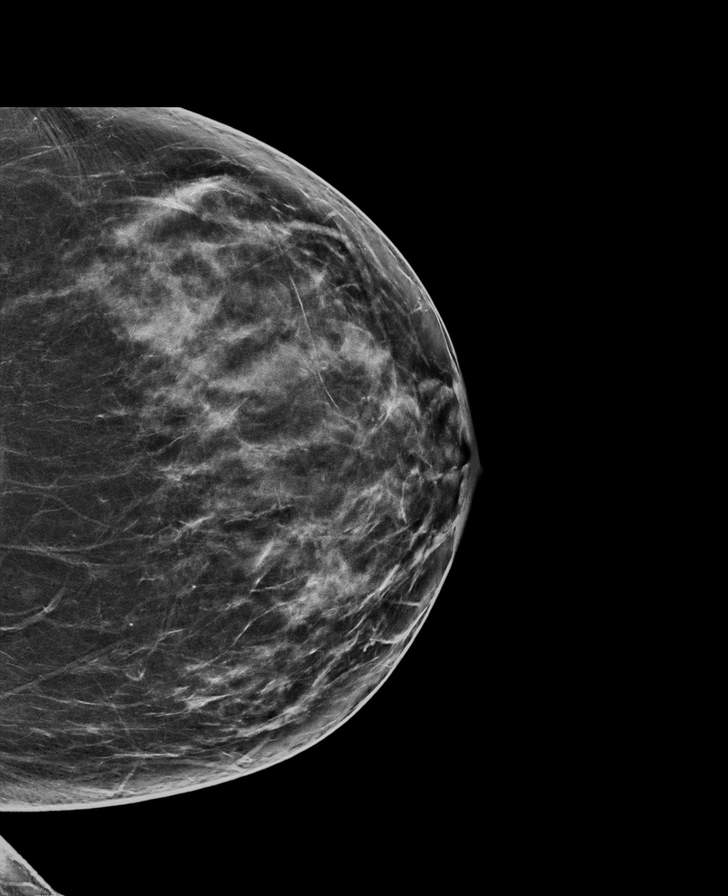

[L CC tomo · tomo slice 41/82.0]
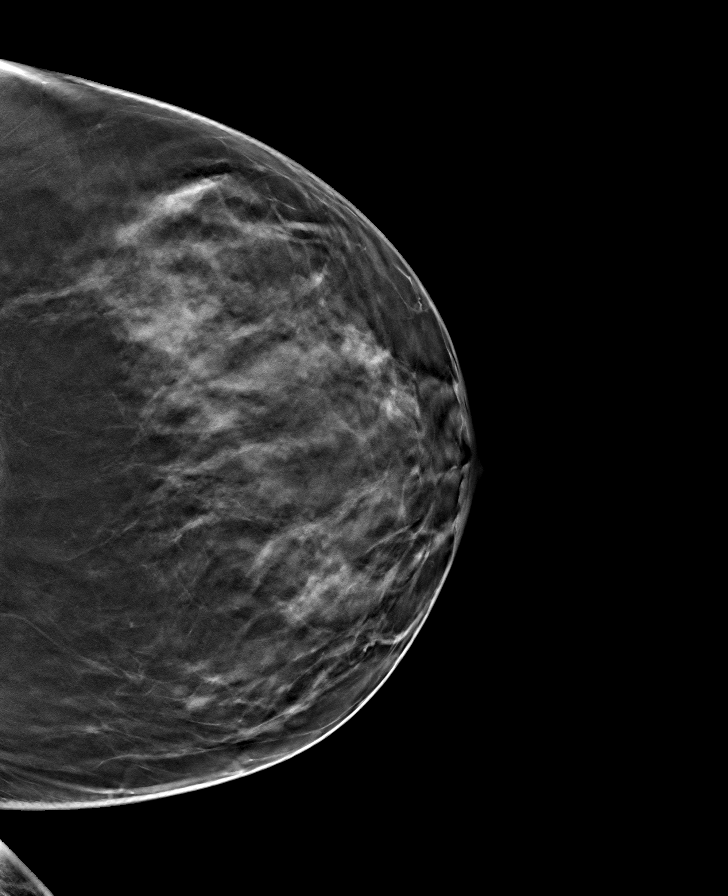

[R CC tomo · tomo slice 43/85.0]
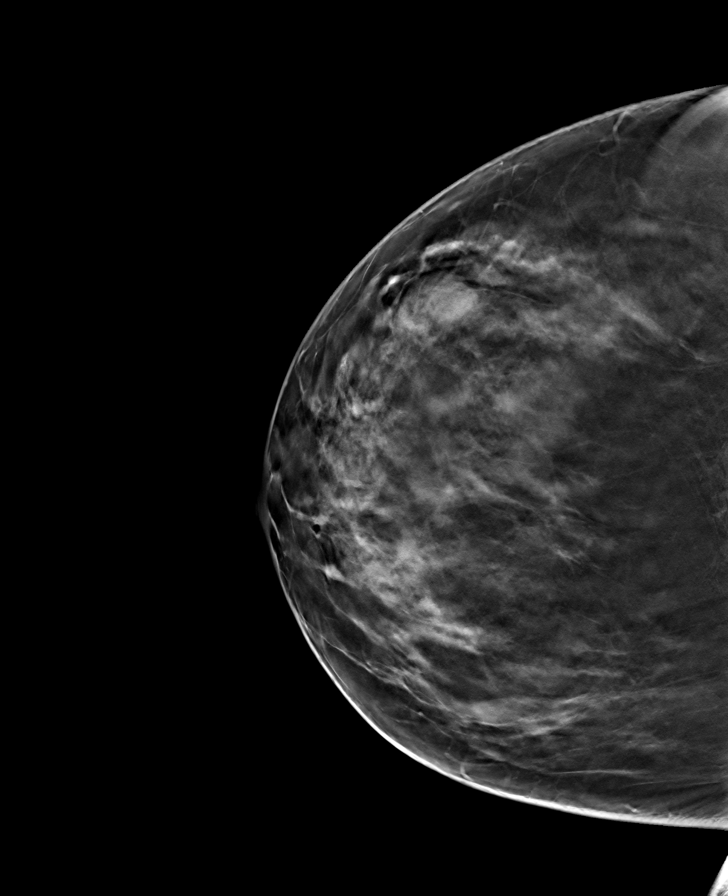

[R MLO tomo · tomo slice 46/91.0]
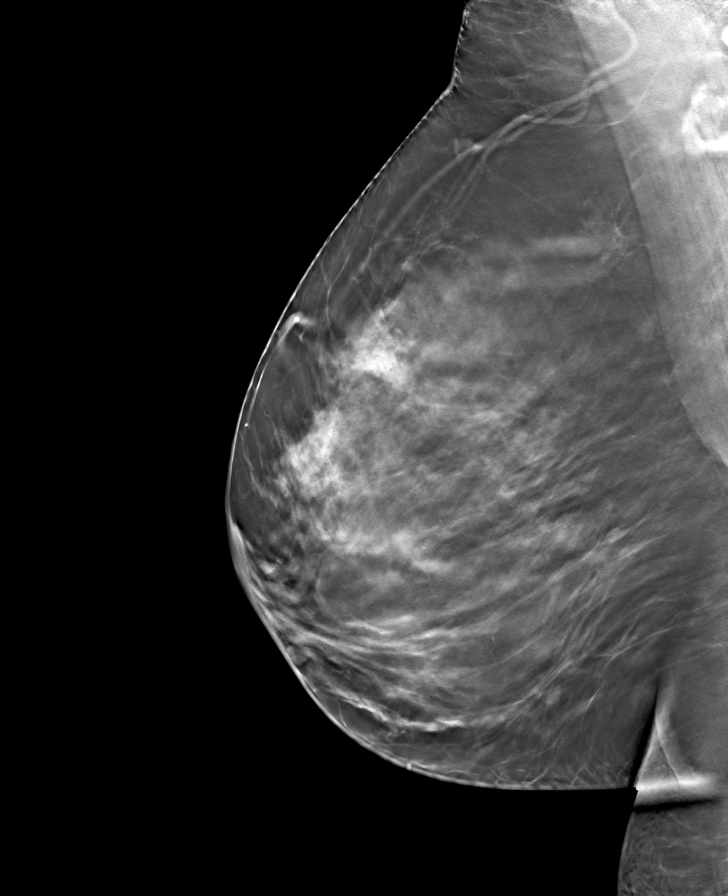

[L MLO tomo · tomo slice 45/89.0]
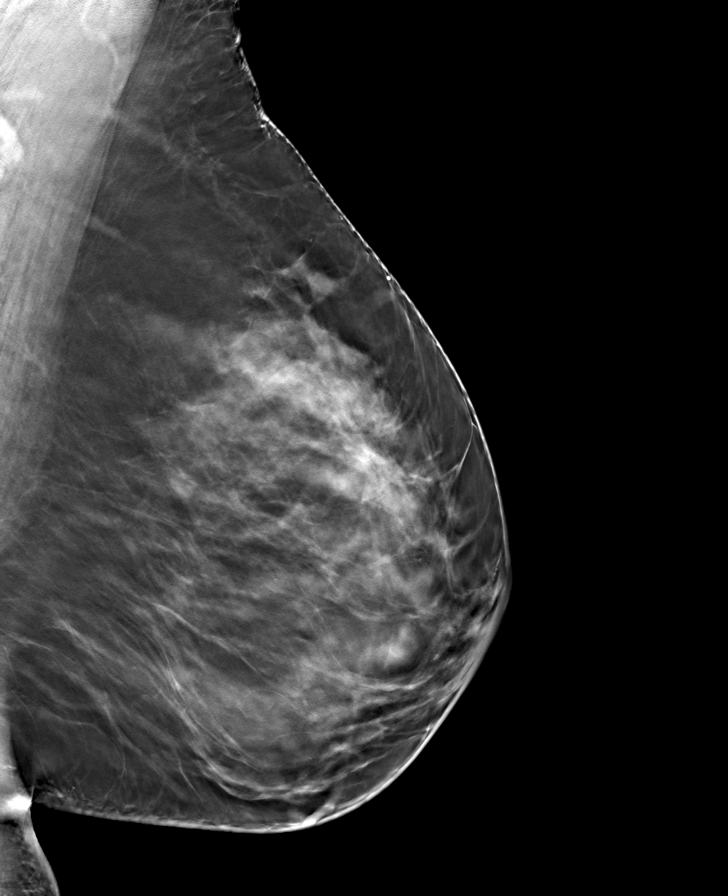

[8 of 24 positions shown; findings below may reference images not displayed]

ACR Breast Density Category c: The breast tissue is heterogeneously
dense, which may obscure small masses.
FINDINGS: No suspicious masses or calcifications are seen in either breast.
Stable appearance of oval circumscribed mass in the medial
periareolar right breast.

Targeted ultrasound of the right breast was performed. The oval
circumscribed hypoechoic mass in the right breast at 6 o'clock
periareolar measures 0.7 x 0.5 x 0.5 cm, unchanged in size and
appearance when compared to prior ultrasound from [DATE]. This
is considered benign given 2 years of stability.
IMPRESSION: No findings of malignancy in either breast.

RECOMMENDATION:
Screening mammogram in one year.(Code:[WR])

I have discussed the findings and recommendations with the patient.
If applicable, a reminder letter will be sent to the patient
regarding the next appointment.

BI-RADS CATEGORY  2: Benign.

## 2021-11-01 IMAGING — US US BREAST*R* LIMITED INC AXILLA
1 series · 12 of 12 positions shown · non-contrast
Comparison: Previous exams.

CLINICAL DATA: Follow-up for probably benign right breast mass.

EXAM:
DIGITAL DIAGNOSTIC BILATERAL MAMMOGRAM WITH TOMOSYNTHESIS AND CAD;
ULTRASOUND RIGHT BREAST LIMITED
TECHNIQUE: Bilateral digital diagnostic mammography and breast tomosynthesis
was performed. The images were evaluated with computer-aided
detection.; Targeted ultrasound examination of the right breast was
performed

[Series 1: us breast*right* limited inc axilla · 0.07mm/px · 12 of 12 slices shown]
[im 1/12]
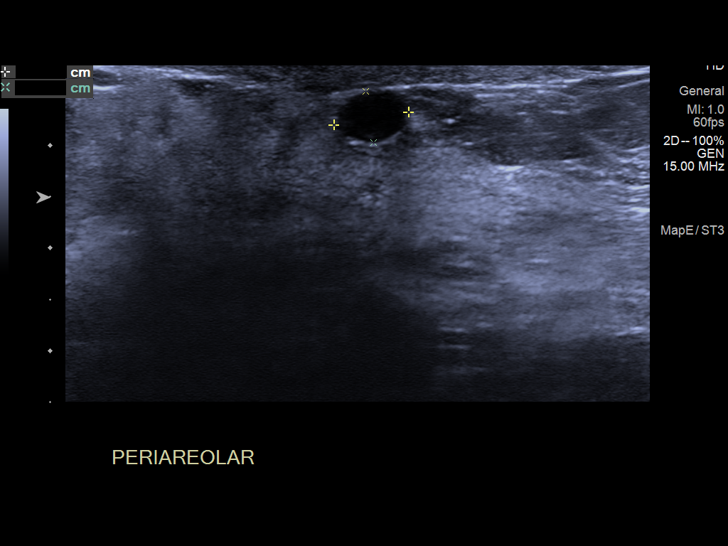
[im 2/12]
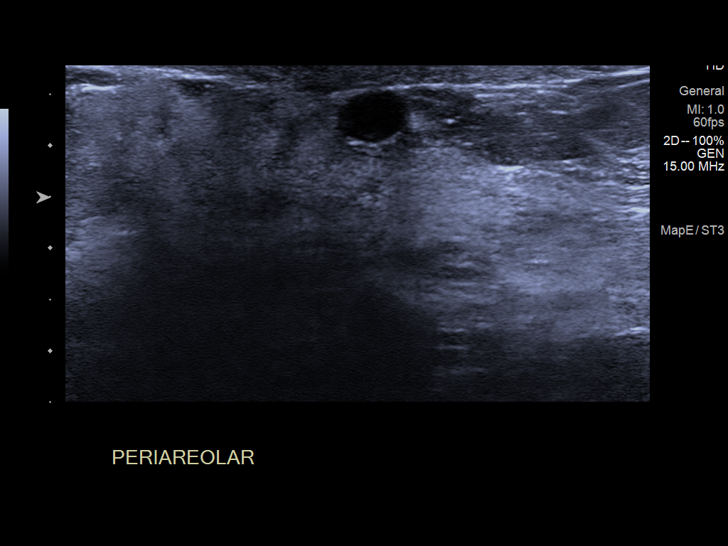
[im 3/12]
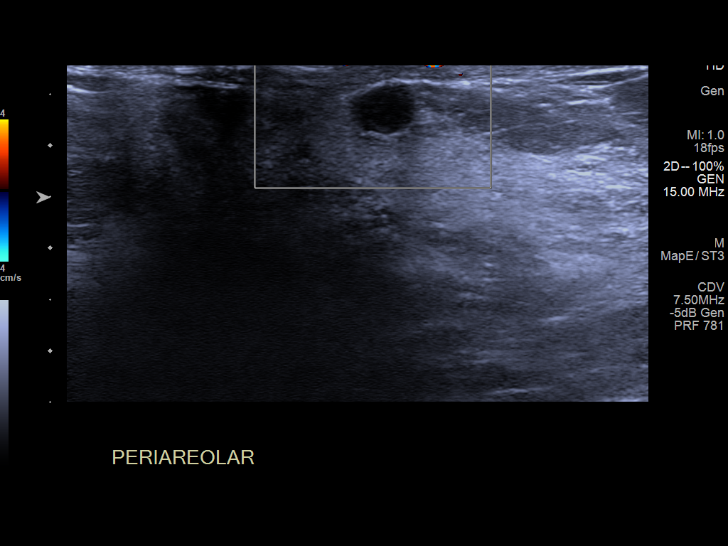
[im 4/12]
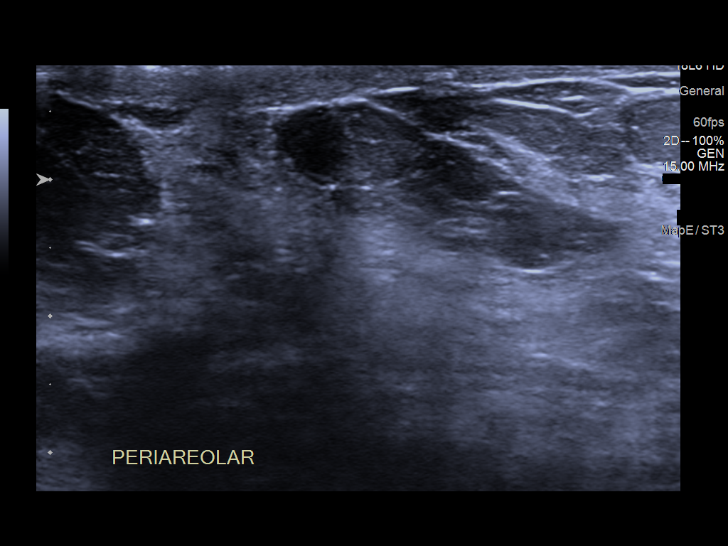
[im 5/12]
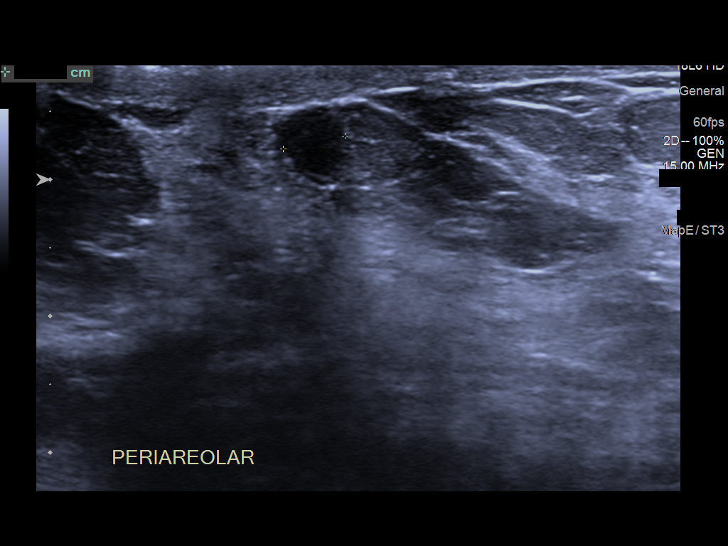
[im 6/12]
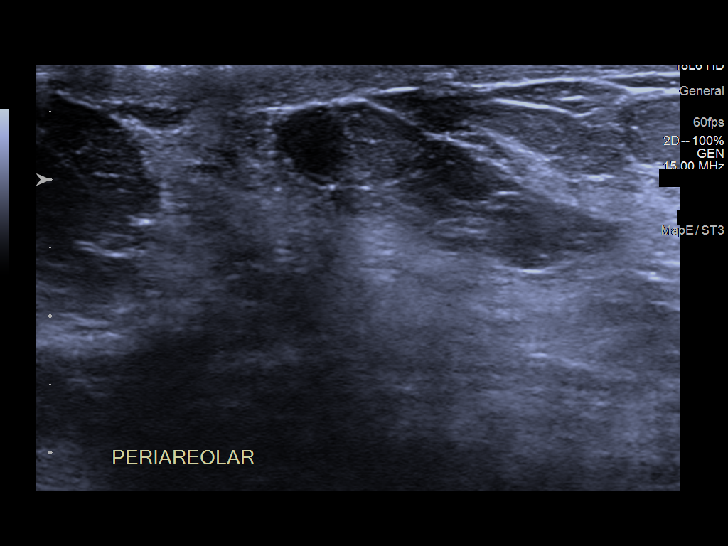
[im 7/12]
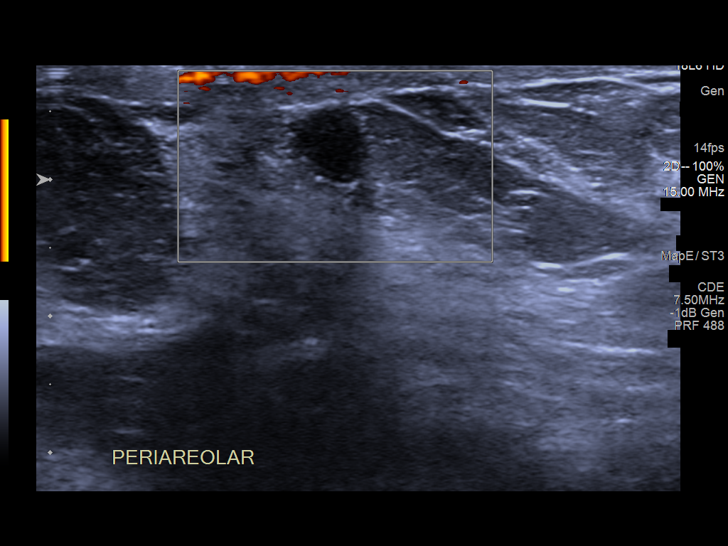
[im 8/12]
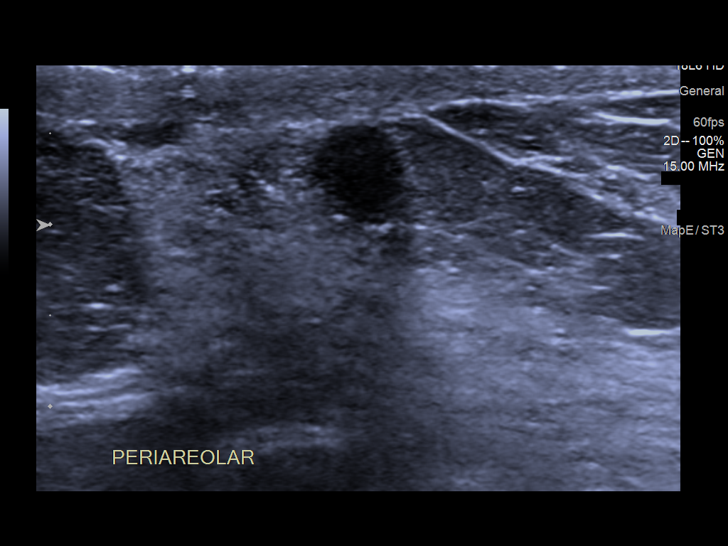
[im 9/12]
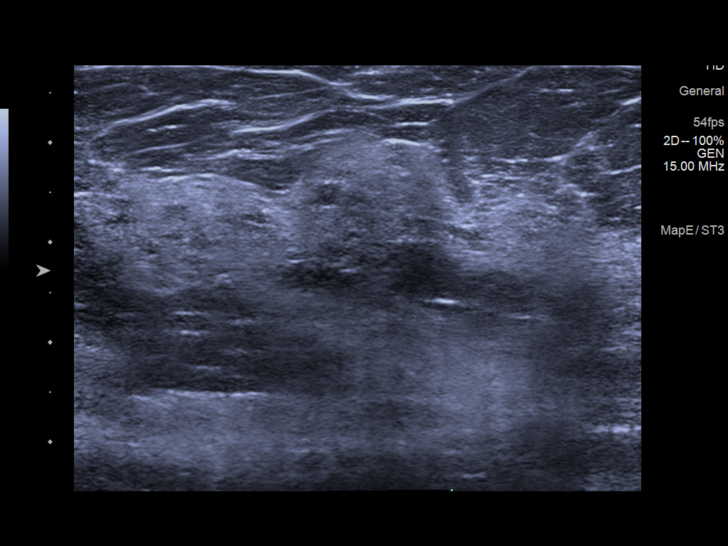
[im 10/12]
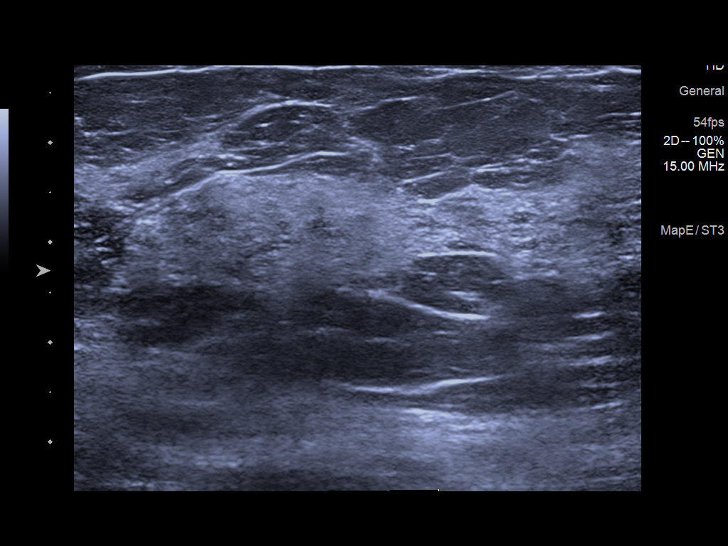
[im 11/12]
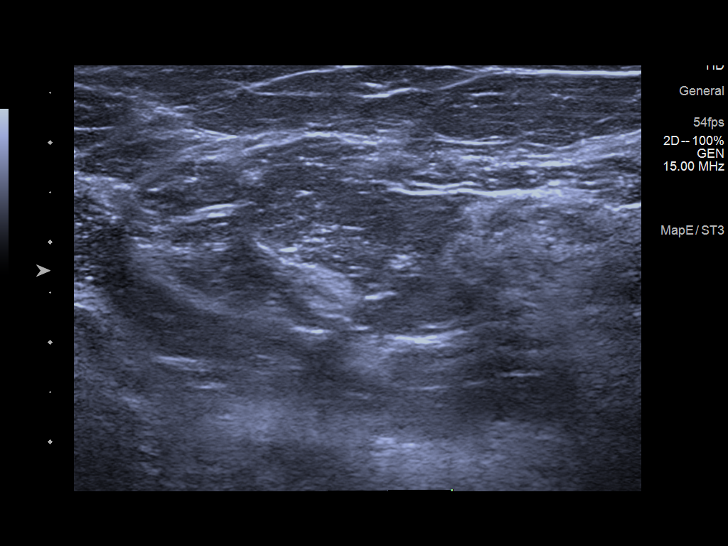
[im 12/12]
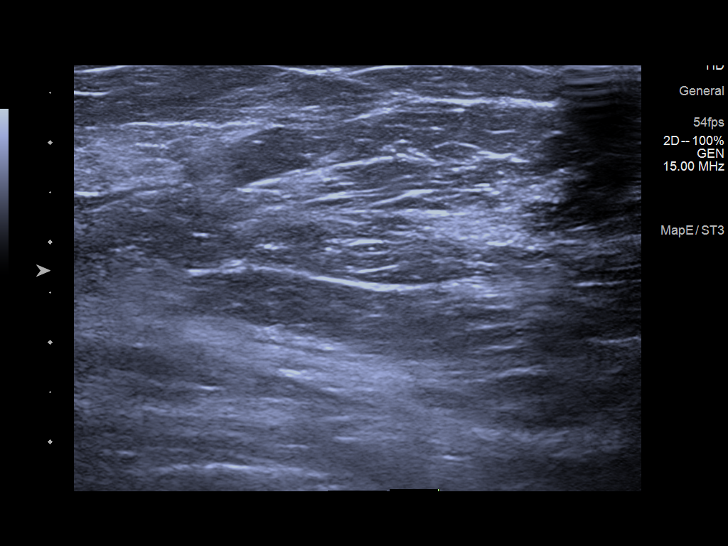

[12 of 12 positions shown; findings below may reference images not displayed]

ACR Breast Density Category c: The breast tissue is heterogeneously
dense, which may obscure small masses.
FINDINGS: No suspicious masses or calcifications are seen in either breast.
Stable appearance of oval circumscribed mass in the medial
periareolar right breast.

Targeted ultrasound of the right breast was performed. The oval
circumscribed hypoechoic mass in the right breast at 6 o'clock
periareolar measures 0.7 x 0.5 x 0.5 cm, unchanged in size and
appearance when compared to prior ultrasound from [DATE]. This
is considered benign given 2 years of stability.
IMPRESSION: No findings of malignancy in either breast.

RECOMMENDATION:
Screening mammogram in one year.(Code:[WR])

I have discussed the findings and recommendations with the patient.
If applicable, a reminder letter will be sent to the patient
regarding the next appointment.

BI-RADS CATEGORY  2: Benign.

## 2021-11-28 ENCOUNTER — Encounter: Payer: Self-pay | Admitting: Orthopaedic Surgery

## 2021-11-28 ENCOUNTER — Telehealth: Payer: Self-pay | Admitting: Psychiatry

## 2021-11-28 ENCOUNTER — Other Ambulatory Visit: Payer: Self-pay | Admitting: Physician Assistant

## 2021-11-28 ENCOUNTER — Encounter: Payer: Self-pay | Admitting: *Deleted

## 2021-11-28 MED ORDER — ZONISAMIDE 100 MG PO CAPS: 200.0000 mg | ORAL_CAPSULE | Freq: Every day | ORAL | 2 refills | Status: DC

## 2021-11-28 NOTE — Telephone Encounter (Signed)
Pt called wanting to inform the provider that the zonisamide (ZONEGRAN) 100 MG capsule seems to be working for her real well. Pt would like a refill sent to the [facility] on [address].  ?

## 2021-11-28 NOTE — Telephone Encounter (Signed)
Refilled x 3 months and sent her my chart to schedule a follow up. ?

## 2021-11-30 ENCOUNTER — Other Ambulatory Visit: Payer: Self-pay | Admitting: Physician Assistant

## 2021-11-30 MED ORDER — TRAMADOL HCL 50 MG PO TABS: 50.0000 mg | ORAL_TABLET | Freq: Three times a day (TID) | ORAL | 2 refills | Status: DC | PRN

## 2021-11-30 NOTE — Telephone Encounter (Signed)
refilled 

## 2022-03-26 ENCOUNTER — Telehealth: Payer: Self-pay | Admitting: Psychiatry

## 2022-03-26 MED ORDER — ZONISAMIDE 100 MG PO CAPS: 200.0000 mg | ORAL_CAPSULE | Freq: Every day | ORAL | 2 refills | Status: DC

## 2022-03-26 NOTE — Telephone Encounter (Signed)
Refilled as requested. Patient has FU in Sept.

## 2022-03-26 NOTE — Telephone Encounter (Signed)
Pt is calling and requesting refill zonisamide (ZONEGRAN) 100 MG capsule. Pt is requesting prescription be sent to  [id number] .

## 2022-04-22 NOTE — Progress Notes (Unsigned)
   CC:  headaches  Virtual Visit via Video Note  I connected with Daiva Eves on 04/22/22 at  1:00 PM EDT by a video enabled telemedicine application and verified that I am speaking with the correct person using two identifiers.  Location: Patient: home Provider: office   I discussed the limitations of evaluation and management by telemedicine and the availability of in person appointments. The patient expressed understanding and agreed to proceed.   Follow-up Visit  Last visit: 09/06/21  Brief HPI: 46 year old female who follows in clinic for chronic migraines. MRA head 06/21/22 was normal. 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  At her last visit she was continued on Zonisamide for headache prevention. Imitrex was switched to Zomig for rescue.  Interval History: She has had improvement in her migraines since starting Zonisamide. She did run out of her prescription and had daily headaches when she was off of the medication. Restarted the Zonisamide and headaches improved again. Currently she only has severe headaches around her menstrual cycle.  Zomig helped but was too expensive. Takes sumatriptan for more severe headaches but tries to avoid it because it makes her drowsy. She will typically take Tylenol as needed.   She has noticed some increased blurred vision since having COVID in March of this year. Also notes headaches are worse with coughing and sneezing. She is concerned about this given her history of empty sella.  Headache days per month: 1 Headache free days per month: 29  Current Headache Regimen: Preventative: zonisamide 200 mg daily Abortive: sumatriptan   Prior Therapies                                  Effexor - weight gain, significant withdrawal symptoms Topamax 100 mg BID - brain fog Zonisamide 200 mg daily - helped Sumatriptan Promethazine Zofran - helped Occipital nerve block -  helped  Physical Exam:  GENERAL:  well appearing, in no acute distress, alert  HEAD:  Normocephalic/atraumatic.  NEUROLOGICAL: Mental Status: Alert, oriented to person, place and time, Follows commands, and Speech fluent and appropriate. Cranial Nerves: face symmetric, no dysarthria, hearing grossly intact Motor: moves all extremities equally   IMPRESSION: 46 year old female who presents for follow up of chronic migraines. Her headaches have been well-controlled with zonisamide for prevention. Imitrex continues to make her drowsy so she tries to avoid taking it. Will start naratriptan for rescue. Counseled on mini-prevention strategies to help prevent migraines around her menstrual cycle. She is concerned about new blurred vision and positional headaches which began after COVID infection in March. Will have her return to ophthalmology to assess for causes of her blurred vision.  PLAN: -Preventive: Continue zonisamide 200 mg daily -Rescue: Start naratriptan 2.5 mg PRN, Zofran 8 mg PRN for nausea -Referral to ophthalmology for blurred vision   Follow-up: 1 year or sooner if needed  I spent a total of 22 minutes on the date of the service. Headache education was done. Discussed treatment options including preventive and acute medications. Discussed medication side effects, adverse reactions and drug interactions. Written educational materials and patient instructions outlining all of the above were given.  Ocie Doyne, MD 04/23/22 1:16 PM

## 2022-04-23 ENCOUNTER — Telehealth: Payer: Commercial Managed Care - HMO | Admitting: Psychiatry

## 2022-04-23 DIAGNOSIS — G43719 Chronic migraine without aura, intractable, without status migrainosus: Secondary | ICD-10-CM

## 2022-04-23 DIAGNOSIS — H538 Other visual disturbances: Secondary | ICD-10-CM | POA: Diagnosis not present

## 2022-04-23 MED ORDER — NARATRIPTAN HCL 2.5 MG PO TABS: 2.5000 mg | ORAL_TABLET | ORAL | 6 refills | Status: DC | PRN

## 2022-04-23 MED ORDER — ONDANSETRON HCL 8 MG PO TABS: 8.0000 mg | ORAL_TABLET | Freq: Three times a day (TID) | ORAL | 6 refills | Status: DC | PRN

## 2022-04-23 MED ORDER — ZONISAMIDE 100 MG PO CAPS
200.0000 mg | ORAL_CAPSULE | Freq: Every day | ORAL | 11 refills | Status: DC
Start: 2022-04-23 — End: 2023-05-05

## 2022-04-23 NOTE — Patient Instructions (Addendum)
Start Amerge (naratriptan) 2.5 mg as needed for migraines. Take one pill at onset of migraine. May repeat a dose in 4 hours if headache persists. This medication can also be used for mini-prevention during the menstrual cycle.  Mini?prevention is a preventive treatment given before and during the menstrual window. Taking naratriptan regularly during the menstrual cycle can help prevent menstrual migraines:  - Amerge (naratriptan) 1 - Take 1 mg twice a day for 5 days or until menstrual cycle ends

## 2022-04-24 ENCOUNTER — Telehealth: Payer: Self-pay | Admitting: Psychiatry

## 2022-04-24 NOTE — Telephone Encounter (Signed)
Sent to Dr. Groat ph # [phone] 

## 2022-06-11 ENCOUNTER — Ambulatory Visit: Payer: Commercial Managed Care - HMO

## 2022-06-11 ENCOUNTER — Ambulatory Visit: Payer: Commercial Managed Care - HMO | Admitting: Podiatry

## 2022-06-11 DIAGNOSIS — M21619 Bunion of unspecified foot: Secondary | ICD-10-CM

## 2022-06-11 DIAGNOSIS — M7752 Other enthesopathy of left foot: Secondary | ICD-10-CM

## 2022-06-11 NOTE — Progress Notes (Signed)
   Chief Complaint  Patient presents with   Foot Pain    Possible bunion, left foot     HPI: 46 y.o. female presenting today as a new patient for evaluation of pain and tenderness associated to the left foot that has been going on for about 2 years now.  Patient does recall an incident of stubbing and injuring her toes.  She has been actively treated and managed at [facility] with Dr. Massie Maroon.  She presents for further treatment and evaluation  Past Medical History:  Diagnosis Date   Headache     No past surgical history on file.  No Known Allergies   Physical Exam: General: The patient is alert and oriented x3 in no acute distress.  Dermatology: Skin is warm, dry and supple bilateral lower extremities. Negative for open lesions or macerations.  Vascular: Palpable pedal pulses bilaterally. Capillary refill within normal limits.  Negative for any significant edema or erythema  Neurological: Light touch and protective threshold grossly intact  Musculoskeletal Exam: No pedal deformities noted.  Pain and tenderness associated with palpation and range of motion to the digits of the left foot.  No pain throughout the metatarsals or midtarsal joints.  Radiographic Exam LT foot 06/11/2022:  Normal osseous mineralization. Joint spaces preserved. No fracture/dislocation/boney destruction.    Assessment: 1.   Chronic left foot pain x2 years after stubbing her toes   Plan of Care:  1. Patient evaluated. X-Rays reviewed.  2.  Unfortunately the patient has tried multiple conservative modalities with no relief including cortisone injections at [facility] with Dr. Massie Maroon.  3.  MRI ordered LT toes w/o contrast 4.  Return to clinic after MRI to review results and discuss treatment options   *Encouraged me to read about Emmett Till. Runs a nonprofit for community orginizing   Edrick Kins, DPM [facility]  Dr. Edrick Kins, DPM    [address], [place] [address]                Office [phone]  Fax [phone]

## 2022-06-24 ENCOUNTER — Encounter: Payer: Self-pay | Admitting: Podiatry

## 2022-06-30 ENCOUNTER — Other Ambulatory Visit: Payer: Self-pay

## 2022-09-05 ENCOUNTER — Ambulatory Visit
Admission: EM | Admit: 2022-09-05 | Discharge: 2022-09-05 | Disposition: A | Discharge status: Home / Self Care | Payer: 59 | Attending: Physician Assistant | Admitting: Physician Assistant

## 2022-09-05 ENCOUNTER — Encounter: Payer: Self-pay | Admitting: Emergency Medicine

## 2022-09-05 DIAGNOSIS — J069 Acute upper respiratory infection, unspecified: Secondary | ICD-10-CM | POA: Diagnosis not present

## 2022-09-05 LAB — POCT RAPID STREP A (OFFICE): Rapid Strep A Screen: NEGATIVE

## 2022-09-05 NOTE — ED Triage Notes (Signed)
Generalized body aches, headache, sore throat, chills since this past weekend, reports tmax of 100 at home. Taking nyquil cold and flu, as well as 650 of tylenol. Also takes tramadol and diclofenac for chronic pain. Two negative covid tests at home.

## 2022-09-05 NOTE — ED Provider Notes (Signed)
[facility]    CSN: [id number] Arrival date & time: 09/05/22  1108      History   Chief Complaint Chief Complaint  Patient presents with   Generalized Body Aches   Sore Throat    HPI Dawn Hicks is a 47 y.o. female.   Patient here today for evaluation of headache, body aches, sore throat, chills that started this past weekend. She reports Tmax of 100 F at home. She has been taking OTC meds without resolution of symptoms. She has taken two covid tests that were both negative at home.   The history is provided by the patient.  Sore Throat Associated symptoms include headaches. Pertinent negatives include no abdominal pain and no shortness of breath.    Past Medical History:  Diagnosis Date   Headache     There are no problems to display for this patient.   History reviewed. No pertinent surgical history.  OB History   No obstetric history on file.      Home Medications    Prior to Admission medications   Medication Sig Start Date End Date Taking? Authorizing Provider  naratriptan (AMERGE) 2.5 MG tablet Take 1 tablet (2.5 mg total) by mouth as needed for migraine. Take one (1) tablet at onset of headache; if returns or does not resolve, may repeat after 4 hours; do not exceed five (5) mg in 24 hours. 04/23/22   Genia Harold, MD  ondansetron (ZOFRAN) 8 MG tablet Take 1 tablet (8 mg total) by mouth every 8 (eight) hours as needed for nausea or vomiting. 04/23/22   Genia Harold, MD  promethazine (PHENERGAN) 12.5 MG tablet Take 1 tablet (12.5 mg total) by mouth every 6 (six) hours as needed for nausea or vomiting. 10/23/20   Pieter Partridge, DO  traMADol (ULTRAM) 50 MG tablet Take 1 tablet (50 mg total) by mouth 3 (three) times daily as needed. 11/30/21   Aundra Dubin, PA-C  zonisamide (ZONEGRAN) 100 MG capsule Take 2 capsules (200 mg total) by mouth daily. 04/23/22   Genia Harold, MD    Family History Family History  Problem Relation Age  of Onset   Breast cancer Maternal Aunt     Social History Social History   Tobacco Use   Smoking status: Never   Smokeless tobacco: Never  Vaping Use   Vaping Use: Never used  Substance Use Topics   Alcohol use: Never   Drug use: Not Currently     Allergies   Patient has no known allergies.   Review of Systems Review of Systems  Constitutional:  Positive for chills and fever.  HENT:  Positive for congestion and sore throat. Negative for ear pain.   Eyes:  Negative for discharge and redness.  Respiratory:  Positive for cough. Negative for shortness of breath and wheezing.   Gastrointestinal:  Negative for abdominal pain, diarrhea, nausea and vomiting.  Musculoskeletal:  Positive for myalgias.  Neurological:  Positive for headaches.     Physical Exam Triage Vital Signs ED Triage Vitals [09/05/22 1122]  Enc Vitals Group     BP (!) 140/82     Pulse Rate (!) 105     Resp 16     Temp 98.1 F (36.7 C)     Temp Source Oral     SpO2 97 %     Weight      Height      Head Circumference      Peak Flow  Pain Score 9     Pain Loc      Pain Edu?      Excl. in [place]?    No data found.  Updated Vital Signs BP (!) 140/82 (BP Location: Left Arm)   Pulse (!) 105   Temp 98.1 F (36.7 C) (Oral)   Resp 16   LMP 09/05/2022   SpO2 97%      Physical Exam Vitals and nursing note reviewed.  Constitutional:      General: She is not in acute distress.    Appearance: Normal appearance. She is not ill-appearing.  HENT:     Head: Normocephalic and atraumatic.     Right Ear: Tympanic membrane normal.     Left Ear: Tympanic membrane normal.     Nose: Congestion present.     Mouth/Throat:     Mouth: Mucous membranes are moist.     Pharynx: No oropharyngeal exudate or posterior oropharyngeal erythema.  Eyes:     Conjunctiva/sclera: Conjunctivae normal.  Cardiovascular:     Rate and Rhythm: Normal rate and regular rhythm.     Heart sounds: Normal heart sounds. No murmur  heard. Pulmonary:     Effort: Pulmonary effort is normal. No respiratory distress.     Breath sounds: Normal breath sounds. No wheezing, rhonchi or rales.  Skin:    General: Skin is warm and dry.  Neurological:     Mental Status: She is alert.  Psychiatric:        Mood and Affect: Mood normal.        Thought Content: Thought content normal.      UC Treatments / Results  Labs (all labs ordered are listed, but only abnormal results are displayed) Labs Reviewed  POCT RAPID STREP A (OFFICE)    EKG   Radiology No results found.  Procedures Procedures (including critical care time)  Medications Ordered in UC Medications - No data to display  Initial Impression / Assessment and Plan / UC Course  I have reviewed the triage vital signs and the nursing notes.  Pertinent labs & imaging results that were available during my care of the patient were reviewed by me and considered in my medical decision making (see chart for details).    Suspect viral etiology of symptoms. Rapid strep negative. Patient outside of treatment window for influenza. Recommended increased fluids, rest and symptomatic treatment with follow up if no gradual improvement of symptoms over the next several days or sooner with any worsening.   Final Clinical Impressions(s) / UC Diagnoses   Final diagnoses:  Acute upper respiratory infection   Discharge Instructions   None    ED Prescriptions   None    PDMP not reviewed this encounter.   Francene Finders, PA-C 09/05/22 1240

## 2022-10-30 ENCOUNTER — Other Ambulatory Visit: Payer: Self-pay | Admitting: Family Medicine

## 2022-10-30 DIAGNOSIS — Z1231 Encounter for screening mammogram for malignant neoplasm of breast: Secondary | ICD-10-CM

## 2022-11-04 ENCOUNTER — Ambulatory Visit (HOSPITAL_COMMUNITY)
Admission: RE | Admit: 2022-11-04 | Discharge: 2022-11-04 | Disposition: A | Discharge status: Home / Self Care | Payer: 59 | Source: Ambulatory Visit | Attending: Family Medicine | Admitting: Family Medicine

## 2022-11-04 DIAGNOSIS — Z1231 Encounter for screening mammogram for malignant neoplasm of breast: Secondary | ICD-10-CM | POA: Diagnosis not present

## 2022-12-13 ENCOUNTER — Ambulatory Visit: Payer: 59

## 2022-12-31 DIAGNOSIS — I10 Essential (primary) hypertension: Secondary | ICD-10-CM | POA: Diagnosis not present

## 2022-12-31 DIAGNOSIS — M545 Low back pain, unspecified: Secondary | ICD-10-CM | POA: Diagnosis not present

## 2022-12-31 DIAGNOSIS — Z833 Family history of diabetes mellitus: Secondary | ICD-10-CM | POA: Diagnosis not present

## 2022-12-31 DIAGNOSIS — E669 Obesity, unspecified: Secondary | ICD-10-CM | POA: Diagnosis not present

## 2022-12-31 DIAGNOSIS — Z8249 Family history of ischemic heart disease and other diseases of the circulatory system: Secondary | ICD-10-CM | POA: Diagnosis not present

## 2022-12-31 DIAGNOSIS — Z6832 Body mass index (BMI) 32.0-32.9, adult: Secondary | ICD-10-CM | POA: Diagnosis not present

## 2022-12-31 DIAGNOSIS — G43909 Migraine, unspecified, not intractable, without status migrainosus: Secondary | ICD-10-CM | POA: Diagnosis not present

## 2022-12-31 DIAGNOSIS — R32 Unspecified urinary incontinence: Secondary | ICD-10-CM | POA: Diagnosis not present

## 2022-12-31 DIAGNOSIS — R011 Cardiac murmur, unspecified: Secondary | ICD-10-CM | POA: Diagnosis not present

## 2023-01-30 DIAGNOSIS — L299 Pruritus, unspecified: Secondary | ICD-10-CM | POA: Diagnosis not present

## 2023-01-30 DIAGNOSIS — R490 Dysphonia: Secondary | ICD-10-CM | POA: Diagnosis not present

## 2023-01-30 DIAGNOSIS — J382 Nodules of vocal cords: Secondary | ICD-10-CM | POA: Diagnosis not present

## 2023-01-30 DIAGNOSIS — H9201 Otalgia, right ear: Secondary | ICD-10-CM | POA: Diagnosis not present

## 2023-02-05 ENCOUNTER — Encounter: Payer: Self-pay | Admitting: Gastroenterology

## 2023-02-14 ENCOUNTER — Ambulatory Visit: Payer: 59 | Admitting: Gastroenterology

## 2023-02-17 ENCOUNTER — Ambulatory Visit: Payer: 59 | Admitting: Gastroenterology

## 2023-02-17 ENCOUNTER — Ambulatory Visit (INDEPENDENT_AMBULATORY_CARE_PROVIDER_SITE_OTHER)
Admission: RE | Admit: 2023-02-17 | Discharge: 2023-02-17 | Disposition: A | Discharge status: Home / Self Care | Payer: 59 | Source: Ambulatory Visit | Attending: Gastroenterology | Admitting: Gastroenterology

## 2023-02-17 ENCOUNTER — Encounter: Payer: Self-pay | Admitting: Gastroenterology

## 2023-02-17 ENCOUNTER — Other Ambulatory Visit: Payer: 59

## 2023-02-17 DIAGNOSIS — K219 Gastro-esophageal reflux disease without esophagitis: Secondary | ICD-10-CM

## 2023-02-17 DIAGNOSIS — R198 Other specified symptoms and signs involving the digestive system and abdomen: Secondary | ICD-10-CM

## 2023-02-17 DIAGNOSIS — R110 Nausea: Secondary | ICD-10-CM | POA: Diagnosis not present

## 2023-02-17 DIAGNOSIS — R140 Abdominal distension (gaseous): Secondary | ICD-10-CM

## 2023-02-17 NOTE — Progress Notes (Signed)
Chief Complaint: Bloating Primary GI MD: Gentry Fitz  HPI: 47 year old with no significant past medical history presents for evaluation of nausea, bloating, and irregular bowel habits.  Patient states over the last few months she has had significant abdominal bloating and gas.  States she cannot control her flatulence at times and feels her abdomen becomes significantly bloated.  Not associated with eating.  No certain time of day.  Has tried multiple OTC gas remedies without relief. States the only thing that has helped has been Kaopectate.  She is on omeprazole 40 Mg (recently started) for silent reflux per ENT secondary to hoarse voice.  She has not noticed a difference yet but she had just started taking it.  She also states chronic nausea.  She does have a history of migraines which complicates her nausea.  States her bowel movements have always been "irregularly irregular."  States she will go multiple days without a bowel movement.  Sometimes she will have pellet-like bowel movements, sometimes diarrhea, sometimes "logs ", and sometimes normal soft formed stools.  Denies melena/hematochezia.  Denies abdominal pain.  Family history of colon polyps in her mother.  Last colonoscopy was in 2021 or 2022 done by Dr. Bosie Clos.  She reported 1 polyp with a repeat of 3 to 5 years.  Will obtain records.  Recently visited [place] in [place] and concerned her bloating could be secondary to parasites.   PREVIOUS GI WORKUP     Past Medical History:  Diagnosis Date   Headache     No past surgical history on file.  Current Outpatient Medications  Medication Sig Dispense Refill   naratriptan (AMERGE) 2.5 MG tablet Take 1 tablet (2.5 mg total) by mouth as needed for migraine. Take one (1) tablet at onset of headache; if returns or does not resolve, may repeat after 4 hours; do not exceed five (5) mg in 24 hours. 10 tablet 6   ondansetron (ZOFRAN) 8 MG tablet Take 1 tablet (8 mg total) by  mouth every 8 (eight) hours as needed for nausea or vomiting. 20 tablet 6   promethazine (PHENERGAN) 12.5 MG tablet Take 1 tablet (12.5 mg total) by mouth every 6 (six) hours as needed for nausea or vomiting. 30 tablet 5   traMADol (ULTRAM) 50 MG tablet Take 1 tablet (50 mg total) by mouth 3 (three) times daily as needed. 60 tablet 2   zonisamide (ZONEGRAN) 100 MG capsule Take 2 capsules (200 mg total) by mouth daily. 60 capsule 11   No current facility-administered medications for this visit.    Allergies as of 02/17/2023   (No Known Allergies)    Family History  Problem Relation Age of Onset   Breast cancer Maternal Aunt     Social History   Socioeconomic History   Marital status: Single    Spouse name: Not on file   Number of children: 0   Years of education: Not on file   Highest education level: Not on file  Occupational History   Not on file  Tobacco Use   Smoking status: Never   Smokeless tobacco: Never  Vaping Use   Vaping Use: Never used  Substance and Sexual Activity   Alcohol use: Never   Drug use: Not Currently   Sexual activity: Yes  Other Topics Concern   Not on file  Social History Narrative   Right handed   Lives in one story home with elderly parents   Drinks caffeine   Social Determinants of  Health   Financial Resource Strain: Not on file  Food Insecurity: Not on file  Transportation Needs: Not on file  Physical Activity: Not on file  Stress: Not on file  Social Connections: Not on file  Intimate Partner Violence: Not on file    Review of Systems:    Constitutional: No weight loss, fever, chills, weakness or fatigue HEENT: Eyes: No change in vision               Ears, Nose, Throat:  No change in hearing or congestion Skin: No rash or itching Cardiovascular: No chest pain, chest pressure or palpitations   Respiratory: No SOB or cough Gastrointestinal: See HPI and otherwise negative Genitourinary: No dysuria or change in urinary  frequency Neurological: No headache, dizziness or syncope Musculoskeletal: No new muscle or joint pain Hematologic: No bleeding or bruising Psychiatric: No history of depression or anxiety    Physical Exam:  Vital signs: There were no vitals taken for this visit.  Constitutional: NAD, Well developed, Well nourished, alert and cooperative Head:  Normocephalic and atraumatic. Eyes:   PEERL, EOMI. No icterus. Conjunctiva pink. Respiratory: Respirations even and unlabored. Lungs clear to auscultation bilaterally.   No wheezes, crackles, or rhonchi.  Cardiovascular:  Regular rate and rhythm. No peripheral edema, cyanosis or pallor.  Gastrointestinal:  Soft, mild bloating, nontender. No rebound or guarding.  Hypoactive bowel sounds. No appreciable masses or hepatomegaly. Rectal:  Not performed.  Msk:  Symmetrical without gross deformities. Without edema, no deformity or joint abnormality.  Neurologic:  Alert and  oriented x4;  grossly normal neurologically.  Skin:   Dry and intact without significant lesions or rashes. Psychiatric: Oriented to person, place and time. Demonstrates good judgement and reason without abnormal affect or behaviors.   RELEVANT LABS AND IMAGING: CBC No results found for: "WBC", "RBC", "HGB", "HCT", "PLT", "MCV", "MCH", "MCHC", "RDW", "LYMPHSABS", "MONOABS", "EOSABS", "BASOSABS"  CMP  No results found for: "NA", "K", "CL", "CO2", "GLUCOSE", "BUN", "CREATININE", "CALCIUM", "PROT", "ALBUMIN", "AST", "ALT", "ALKPHOS", "BILITOT", "GFRNONAA", "GFRAA"  Assessment: 1.  GERD 2.  Nausea -Persistent intermittent nausea not associated with eating.  Possibly secondary to GERD versus constipation.  Patient would like EGD for further evaluation to rule out malignancy with her longstanding GERD (typically controlled on omeprazole 20 Mg).  3.  Abdominal bloating -Extensive discussion with patient about etiologies of bloating.  Could be secondary to what sounds like  constipation.  Could be secondary to reflux.  Or diet.  4.  Irregular bowel habits -Suspect patient's irregular bowel habits are secondary to chronic constipation since she will go multiple days without a bowel movement and sometimes have what sounds like overflow diarrhea.  Patient has tried Metamucil without relief.  Bloating and nausea could be secondary to her irregular bowel habits.  Plan: 1.  Abdominal x-ray to rule out stool burden and evaluate bowel gas pattern 2.  Start taking 1 capful (17 grams) 1x / day for 1 week.   If this is not effective, increase to 1 dose 2x / day for 1 week.   If this is still not effective, increase to two capfuls (34 grams) 2x / day.   Can adjust dose as needed based on response. Can take 1/2 cap daily, skip days, or increase per day.   3. Align probiotic for 3 months 4. EGD for evaluation of GERD and nausea. 5. I thoroughly discussed the procedure with the patient (at bedside) to include nature of the procedure, alternatives, benefits, and risks (  including but not limited to bleeding, infection, perforation, anesthesia/cardiac pulmonary complications).  Patient verbalized understanding and gave verbal consent to proceed with EGD. 6. If no improvement in bloating with low FODMAP, regular bowel regimen, and antacid regimen, will get SIBO testing for further evaluation. 7. Ova and parasite stool study. Recent travel to [place].  Boone Master, PA-C [facility] 02/17/2023, 8:45 AM  Cc: Wilfrid Lund, PA

## 2023-02-17 NOTE — Patient Instructions (Addendum)
_______________________________________________________  If your blood pressure at your visit was 140/90 or greater, please contact your primary care physician to follow up on this.  _______________________________________________________  If you are age 48 or younger, your body mass index should be between 19-25. Your Body mass index is 34.61 kg/m. If this is out of the aformentioned range listed, please consider follow up with your Primary Care Provider.  ________________________________________________________  The [facility] providers would like to encourage you to use [facility] to communicate with providers for non-urgent requests or questions.  Due to long hold times on the telephone, sending your provider a message by [facility] may be a faster and more efficient way to get a response.  Please allow 48 business hours for a response.  Please remember that this is for non-urgent requests.  _______________________________________________________  Your provider has requested that you go to the basement level for lab work before leaving today. Press "B" on the elevator. The lab is located at the first door on the left as you exit the elevator.  You can use Align probiotics.  You can use Miralax one capful daily.  You have been scheduled for an endoscopy. Please follow written instructions given to you at your visit today.  If you use inhalers (even only as needed), please bring them with you on the day of your procedure.  If you take any of the following medications, they will need to be adjusted prior to your procedure:   DO NOT TAKE 7 DAYS PRIOR TO TEST- Trulicity (dulaglutide) Ozempic, Wegovy (semaglutide) Mounjaro (tirzepatide) Bydureon Bcise (exanatide extended release)  DO NOT TAKE 1 DAY PRIOR TO YOUR TEST Rybelsus (semaglutide) Adlyxin (lixisenatide) Victoza (liraglutide) Byetta (exanatide) ___________________________________________________________________________  Due to  recent changes in healthcare laws, you may see the results of your imaging and laboratory studies on MyChart before your provider has had a chance to review them.  We understand that in some cases there may be results that are confusing or concerning to you. Not all laboratory results come back in the same time frame and the provider may be waiting for multiple results in order to interpret others.  Please give [place] 48 hours in order for your provider to thoroughly review all the results before contacting the office for clarification of your results.   You are scheduled to follow up on 05-19-23 at 10:00am.  Thank you for entrusting me with your care and choosing [facility].  [place], PA-C

## 2023-02-18 ENCOUNTER — Ambulatory Visit: Payer: 59

## 2023-02-18 DIAGNOSIS — R198 Other specified symptoms and signs involving the digestive system and abdomen: Secondary | ICD-10-CM

## 2023-02-18 DIAGNOSIS — R110 Nausea: Secondary | ICD-10-CM

## 2023-02-18 DIAGNOSIS — K219 Gastro-esophageal reflux disease without esophagitis: Secondary | ICD-10-CM | POA: Diagnosis not present

## 2023-02-18 DIAGNOSIS — R140 Abdominal distension (gaseous): Secondary | ICD-10-CM

## 2023-02-18 NOTE — Progress Notes (Signed)
I agree with the assessment and plan as outlined by Ms. McMichael.

## 2023-02-21 ENCOUNTER — Encounter: Payer: Self-pay | Admitting: Internal Medicine

## 2023-02-21 NOTE — Progress Notes (Signed)
Reviewed records from [facility].   Colonoscopy 06/20/20: 5 mm polyp in the hepatic flexure that was removed with hot snare. Adequate/good prep. Repeat colonoscopy recommended in 5 years. Path: TA  Will have these reports scanned into the system.

## 2023-02-24 LAB — OVA AND PARASITE EXAMINATION
CONCENTRATE RESULT:: NONE SEEN
MICRO NUMBER:: 15176606
SPECIMEN QUALITY:: ADEQUATE
TRICHROME RESULT:: NONE SEEN

## 2023-02-27 ENCOUNTER — Encounter: Payer: Self-pay | Admitting: Certified Registered Nurse Anesthetist

## 2023-02-27 ENCOUNTER — Encounter: Payer: Self-pay | Admitting: Internal Medicine

## 2023-02-28 ENCOUNTER — Encounter: Payer: 59 | Admitting: Internal Medicine

## 2023-03-19 ENCOUNTER — Telehealth: Payer: Self-pay | Admitting: Gastroenterology

## 2023-03-19 NOTE — Telephone Encounter (Signed)
PT is requesting to speak with pre cert as far as how her EGD will be coded. She had an issue the last time she scheduled because she was told the day before she had to pay 750 and forced to cancel. She would like to further discuss this information.

## 2023-03-20 MED ORDER — OMEPRAZOLE 20 MG PO CPDR: 20.0000 mg | DELAYED_RELEASE_CAPSULE | Freq: Every day | ORAL | 3 refills | Status: DC

## 2023-03-20 NOTE — Addendum Note (Signed)
Addended by: Lamona Curl on: 03/20/2023 04:54 PM   Modules accepted: Orders

## 2023-03-20 NOTE — Telephone Encounter (Signed)
Patient notified of Bayley's recommendations to "Continue miralax and probiotic. Continue omeprazole 20mg . Once constipation is under control, If still having symptoms of bloating we can do SIBO test. Stick to low FODMAP diet as well."  Advised patient to return call to our office in 2 to 3 weeks if bloating persists.  Patient agreed to plan and verbalized understanding.

## 2023-03-20 NOTE — Telephone Encounter (Signed)
Pt is requesting a call back from the provider to see if there are any other options for her besides an endoscopy since she states she is not able to afford the procedure after reviewing the cost with her insurance plan. Pt is requesting an alternative option or medications which will be less costly for her.

## 2023-03-20 NOTE — Telephone Encounter (Signed)
Patient also requested that refill of omeprazole 20mg  one capsule daily be sent to [facility], as [facility] in not a preferred pharmacy by her insurance. Rx sent as requested.

## 2023-03-26 DIAGNOSIS — M79645 Pain in left finger(s): Secondary | ICD-10-CM | POA: Diagnosis not present

## 2023-04-09 ENCOUNTER — Encounter: Payer: 59 | Admitting: Internal Medicine

## 2023-04-21 ENCOUNTER — Ambulatory Visit: Payer: 59 | Admitting: Gastroenterology

## 2023-05-01 ENCOUNTER — Telehealth: Payer: Self-pay | Admitting: Gastroenterology

## 2023-05-01 DIAGNOSIS — A09 Infectious gastroenteritis and colitis, unspecified: Secondary | ICD-10-CM

## 2023-05-01 NOTE — Telephone Encounter (Signed)
At our last appointment in July patient was reporting varying bowel movements including pellet-like stools/hard logs/intermittent diarrhea.  She is now calling with worsening diarrhea despite being on Benefiber and MiraLAX.  Was taken off of MiraLAX and Benefiber without improvement.  Has associated abdominal bloating and nausea without vomiting.  Recommend GI pathogen and C. difficile Diatherix to rule out infection and KUB to rule out obstruction.  Will await these results.  ED precautions include not passing gas, severe pain, bleeding, profuse vomiting, dizziness

## 2023-05-01 NOTE — Telephone Encounter (Signed)
PT is calling to speak about her symptoms. She is still having severe abdominal pain, nonstop nausea and vomiting along with bloating. She is very concerned and afraid the symptoms are early signs of uterine cancer. Please advise

## 2023-05-01 NOTE — Telephone Encounter (Signed)
I have spoken to patient to advise of Bayley's recommendation to return for KUB to rule out any obstruction/continued constipation and to complete stool pathogen panel (pick up from 3rd floor) in order to have a better understanding of what is going on. Patient verbalizes understanding and states she will try to come either tomorrow or Monday.

## 2023-05-01 NOTE — Telephone Encounter (Signed)
Would you also like me to order SIBO testing on her (just see this was mentioned in your visit notes as well)?

## 2023-05-01 NOTE — Telephone Encounter (Signed)
Patient calls with complaints of severe nausea, bloating and frequent diarrhea. She states that since her recent office visit, she has been using Align daily, Benefiber 1 teaspoon daily, metamucil and Miralax 1 capful daily. She states that her diarrhea has become so severe that she is unable to work effectively. Over the last several days, patient has discontinued Metamucil and Miralax but has noticed no difference in symptoms. She states that she has also been having "gurgling" in her stomach and severe nausea, but no vomiting. +Bloating as well. Was told to take omeprazole for silent reflux but has been unable to take this because it caused her to have headaches. Patient says she has also been taking alka seltzer (which helps), pepto bismol (does NOT work) and large amounts of kaopectate (does help). Patient expresses that she is desperate for help/additional testing/helpful medication.  Please advise.Marland KitchenMarland Kitchen

## 2023-05-02 ENCOUNTER — Ambulatory Visit (INDEPENDENT_AMBULATORY_CARE_PROVIDER_SITE_OTHER)
Admission: RE | Admit: 2023-05-02 | Discharge: 2023-05-02 | Disposition: A | Discharge status: Home / Self Care | Payer: 59 | Source: Ambulatory Visit | Attending: Gastroenterology | Admitting: Gastroenterology

## 2023-05-02 ENCOUNTER — Other Ambulatory Visit: Payer: Self-pay | Admitting: Family Medicine

## 2023-05-02 ENCOUNTER — Other Ambulatory Visit: Payer: Self-pay | Admitting: Psychiatry

## 2023-05-02 DIAGNOSIS — R197 Diarrhea, unspecified: Secondary | ICD-10-CM | POA: Diagnosis not present

## 2023-05-02 DIAGNOSIS — A09 Infectious gastroenteritis and colitis, unspecified: Secondary | ICD-10-CM | POA: Diagnosis not present

## 2023-05-02 DIAGNOSIS — R140 Abdominal distension (gaseous): Secondary | ICD-10-CM | POA: Diagnosis not present

## 2023-05-05 NOTE — Telephone Encounter (Signed)
Last seen on 04/23/22 No 1 year follow up scheduled

## 2023-05-19 ENCOUNTER — Ambulatory Visit: Payer: 59 | Admitting: Gastroenterology

## 2023-05-20 ENCOUNTER — Encounter: Payer: Self-pay | Admitting: Gastroenterology

## 2023-05-20 ENCOUNTER — Ambulatory Visit: Payer: 59 | Admitting: Gastroenterology

## 2023-05-20 VITALS — BP 122/70 | HR 82 | Ht 65.0 in | Wt 204.6 lb

## 2023-05-20 DIAGNOSIS — K5900 Constipation, unspecified: Secondary | ICD-10-CM | POA: Diagnosis not present

## 2023-05-20 DIAGNOSIS — K219 Gastro-esophageal reflux disease without esophagitis: Secondary | ICD-10-CM

## 2023-05-20 DIAGNOSIS — R140 Abdominal distension (gaseous): Secondary | ICD-10-CM | POA: Diagnosis not present

## 2023-05-20 MED ORDER — LINACLOTIDE 72 MCG PO CAPS: 72.0000 ug | ORAL_CAPSULE | Freq: Every day | ORAL | 2 refills | Status: DC

## 2023-05-20 MED ORDER — FAMOTIDINE 20 MG PO TABS: 20.0000 mg | ORAL_TABLET | Freq: Every day | ORAL | 2 refills | Status: AC

## 2023-05-20 NOTE — Progress Notes (Signed)
Chief Complaint: Follow up of GERD Primary GI MD: Dr. Leonides Schanz  HPI: 47 year old female presents for follow-up of GERD and irregular bowel habits.  Last seen 02/17/2019 for.  At that time she was having intermittent nausea not associated with eating and longstanding GERD.  She was also having chronic constipation with abdominal bloating without relief from Metamucil. Recommended MiraLAX 1 capful daily and adjust dose based on response ova and parasite stool study was negative  X-ray 02/17/2023 showed stool throughout the colon.  Patient then called in noting she was having diarrhea.  Diatherix C. difficile and GI pathogen panel was negative.  Repeat abdominal x-ray was performed 05/11/2023 which showed moderate stool burden  Discussed the use of AI scribe software for clinical note transcription with the patient, who gave verbal consent to proceed.  History of Present Illness   The patient, with a past medical history of a polyp found on colonoscopy, presents with ongoing gastrointestinal symptoms. The patient reports persistent bloating, gas, constipation, and diarrhea. Despite attempts at dietary changes and over-the-counter remedies including Miralax, fiber, and a probiotic, the patient has only seen a 20% improvement in symptoms.   The patient describes the bloating as extensive, with the abdomen distending significantly, and then reducing after passing gas. The patient's bowel movements are described as irregular, often constipated, and occasionally diarrhea. The patient also reports a sensation of internal pulling in the stomach when lifting objects. The patient has been under consistent high levels of stress due to caregiving responsibilities and community work.      PREVIOUS GI WORKUP   Last colonoscopy was in 2021 done by Dr. Bosie Clos. She reported 1 polyp with a repeat in 5 years. (Due 2026)  Past Medical History:  Diagnosis Date   GERD (gastroesophageal reflux disease)    Headache      No past surgical history on file.  Current Outpatient Medications  Medication Sig Dispense Refill   famotidine (PEPCID) 20 MG tablet Take 1 tablet (20 mg total) by mouth at bedtime. 30 tablet 2   linaclotide (LINZESS) 72 MCG capsule Take 1 capsule (72 mcg total) by mouth daily before breakfast. 30 capsule 2   polyethylene glycol (MIRALAX / GLYCOLAX) 17 g packet Take 17 g by mouth daily.     Probiotic Product (ALIGN) 4 MG CAPS Take 1 capsule by mouth daily at 6 (six) AM.     zonisamide (ZONEGRAN) 100 MG capsule Take 2 capsules by mouth once daily 60 capsule 0   No current facility-administered medications for this visit.    Allergies as of 05/20/2023   (No Known Allergies)    Family History  Problem Relation Age of Onset   Diverticulosis Mother    Breast cancer Maternal Aunt    Pancreatic cancer Maternal Uncle    Esophageal cancer Maternal Uncle    Colon cancer Neg Hx    Stomach cancer Neg Hx     Social History   Socioeconomic History   Marital status: Single    Spouse name: Not on file   Number of children: 0   Years of education: Not on file   Highest education level: Not on file  Occupational History   Not on file  Tobacco Use   Smoking status: Never   Smokeless tobacco: Never  Vaping Use   Vaping status: Never Used  Substance and Sexual Activity   Alcohol use: Never   Drug use: Never   Sexual activity: Yes  Other Topics Concern   Not on  file  Social History Narrative   Right handed   Lives in one story home with elderly parents   Drinks caffeine   Social Determinants of Health   Financial Resource Strain: Not on file  Food Insecurity: Not on file  Transportation Needs: Not on file  Physical Activity: Not on file  Stress: Not on file  Social Connections: Not on file  Intimate Partner Violence: Not on file    Review of Systems:    Constitutional: No weight loss, fever, chills, weakness or fatigue HEENT: Eyes: No change in vision                Ears, Nose, Throat:  No change in hearing or congestion Skin: No rash or itching Cardiovascular: No chest pain, chest pressure or palpitations   Respiratory: No SOB or cough Gastrointestinal: See HPI and otherwise negative Genitourinary: No dysuria or change in urinary frequency Neurological: No headache, dizziness or syncope Musculoskeletal: No new muscle or joint pain Hematologic: No bleeding or bruising Psychiatric: No history of depression or anxiety    Physical Exam:  Vital signs: BP 122/70   Pulse 82   Ht 5\' 5"  (1.651 m)   Wt 92.8 kg   LMP 04/25/2023   BMI 34.05 kg/m   Constitutional: NAD, Well developed, Well nourished, alert and cooperative Head:  Normocephalic and atraumatic. Eyes:   PEERL, EOMI. No icterus. Conjunctiva pink. Respiratory: Respirations even and unlabored. Lungs clear to auscultation bilaterally.   No wheezes, crackles, or rhonchi.  Cardiovascular:  Regular rate and rhythm. No peripheral edema, cyanosis or pallor.  Gastrointestinal:  Soft, nondistended, nontender. No rebound or guarding. Normal bowel sounds. No appreciable masses or hepatomegaly. Rectal:  Not performed.  Msk:  Symmetrical without gross deformities. Without edema, no deformity or joint abnormality.  Neurologic:  Alert and  oriented x4;  grossly normal neurologically.  Skin:   Dry and intact without significant lesions or rashes. Psychiatric: Oriented to person, place and time. Demonstrates good judgement and reason without abnormal affect or behaviors.   RELEVANT LABS AND IMAGING: CBC No results found for: "WBC", "RBC", "HGB", "HCT", "PLT", "MCV", "MCH", "MCHC", "RDW", "LYMPHSABS", "MONOABS", "EOSABS", "BASOSABS"  CMP  No results found for: "NA", "K", "CL", "CO2", "GLUCOSE", "BUN", "CREATININE", "CALCIUM", "PROT", "ALBUMIN", "AST", "ALT", "ALKPHOS", "BILITOT", "GFRNONAA", "GFRAA"   Assessment/Plan:     Chronic Constipation Persistent despite dietary changes and over-the-counter  treatments. Patient reports bloating, gas, and irregular bowel movements. Stool studies negative for infectious causes.  KUB with moderate stool burden. Patient feels limited with her health insurance and would prefer to avoid expensive diagnostics -Start Linzess daily in the morning on an empty stomach. -Discontinue Align and fiber supplements. -Check-in with patient in 2.5 weeks (June 06, 2023) to assess response to Linzess. -Consider colonoscopy if no improvement with Linzess. -Can consider CT scan as well.  Gastroesophageal Reflux Disease (GERD) Patient reports nausea and discomfort, possibly related to acid reflux. Previous use of omeprazole caused headaches. -Start Pepcid 20mg  at night. -Continue dietary modifications to reduce acid intake.  General Health Maintenance / Followup Plans -Schedule follow-up appointment in December 2024 or January 2025 to reassess symptoms and treatment plan.      Lara Mulch [facility] 05/20/2023, 3:12 PM  Cc: Wilfrid Lund, PA

## 2023-05-20 NOTE — Patient Instructions (Addendum)
We have sent the following medications to your pharmacy for you to pick up at your convenience: Famotidine, Linzess  Follow up in 3 months  If your blood pressure at your visit was 140/90 or greater, please contact your primary care physician to follow up on this.  _______________________________________________________  If you are age 48 or older, your body mass index should be between 23-30. Your Body mass index is 34.05 kg/m. If this is out of the aforementioned range listed, please consider follow up with your Primary Care Provider.  If you are age 68 or younger, your body mass index should be between 19-25. Your Body mass index is 34.05 kg/m. If this is out of the aformentioned range listed, please consider follow up with your Primary Care Provider.   ________________________________________________________  The [facility] providers would like to encourage you to use [facility] to communicate with providers for non-urgent requests or questions.  Due to long hold times on the telephone, sending your provider a message by [facility] may be a faster and more efficient way to get a response.  Please allow 48 business hours for a response.  Please remember that this is for non-urgent requests.  _______________________________________________________  Thank you for entrusting me with your care and choosing [facility].  Bayley Leanna Sato, PA-C

## 2023-05-21 NOTE — Progress Notes (Signed)
I agree with the assessment and plan as outlined by Ms. McMichael. 

## 2023-06-03 ENCOUNTER — Telehealth: Payer: Self-pay | Admitting: Gastroenterology

## 2023-06-03 NOTE — Telephone Encounter (Signed)
Patient called requesting PA for Linzess medication stated she only has a few left of samples.

## 2023-06-04 ENCOUNTER — Other Ambulatory Visit (HOSPITAL_COMMUNITY): Payer: Self-pay

## 2023-06-04 ENCOUNTER — Telehealth: Payer: Self-pay

## 2023-06-04 NOTE — Telephone Encounter (Signed)
PA request has been Approved. New Encounter created for follow up. For additional info see Pharmacy Prior Auth telephone encounter from 06/04/2023.

## 2023-06-04 NOTE — Telephone Encounter (Signed)
Pharmacy Patient Advocate Encounter   Received notification from Pt Calls Messages that prior authorization for Linzess capsules is required/requested.   Insurance verification completed.   The patient is insured through [employer] .   Per test claim: APPROVED from 06-04-2023 to 06-03-2024

## 2023-06-06 DIAGNOSIS — B009 Herpesviral infection, unspecified: Secondary | ICD-10-CM | POA: Diagnosis not present

## 2023-06-06 DIAGNOSIS — Z01419 Encounter for gynecological examination (general) (routine) without abnormal findings: Secondary | ICD-10-CM | POA: Diagnosis not present

## 2023-06-06 DIAGNOSIS — R140 Abdominal distension (gaseous): Secondary | ICD-10-CM | POA: Diagnosis not present

## 2023-06-06 DIAGNOSIS — N898 Other specified noninflammatory disorders of vagina: Secondary | ICD-10-CM | POA: Diagnosis not present

## 2023-06-06 DIAGNOSIS — N8110 Cystocele, unspecified: Secondary | ICD-10-CM | POA: Diagnosis not present

## 2023-06-06 DIAGNOSIS — Z124 Encounter for screening for malignant neoplasm of cervix: Secondary | ICD-10-CM | POA: Diagnosis not present

## 2023-06-06 DIAGNOSIS — Z113 Encounter for screening for infections with a predominantly sexual mode of transmission: Secondary | ICD-10-CM | POA: Diagnosis not present

## 2023-06-09 MED ORDER — LUBIPROSTONE 8 MCG PO CAPS: 8.0000 ug | ORAL_CAPSULE | Freq: Two times a day (BID) | ORAL | 3 refills | Status: AC

## 2023-06-09 NOTE — Telephone Encounter (Signed)
-----   Message from Legrand Como sent at 06/09/2023 10:45 AM EDT ----- Regarding: FW: call in I called patient to see how she was doing.  Doing well on Linzess, however, with insurance coverage is still over 200 dollars a month.  She wants something cheaper.  Can we try sending in Amitiza 8 mcg twice daily for 30 days refill 3 ----- Message ----- From: Legrand Como, PA-C Sent: 06/06/2023  12:00 AM EDT To: Background Patient List Reminders Subject: call in                                        check if linzess is working

## 2023-06-09 NOTE — Telephone Encounter (Signed)
Rx sent for amitiza.

## 2023-06-16 DIAGNOSIS — Z0000 Encounter for general adult medical examination without abnormal findings: Secondary | ICD-10-CM | POA: Diagnosis not present

## 2023-06-16 DIAGNOSIS — Z13220 Encounter for screening for lipoid disorders: Secondary | ICD-10-CM | POA: Diagnosis not present

## 2023-06-24 DIAGNOSIS — R140 Abdominal distension (gaseous): Secondary | ICD-10-CM | POA: Diagnosis not present

## 2023-06-25 DIAGNOSIS — Z0000 Encounter for general adult medical examination without abnormal findings: Secondary | ICD-10-CM | POA: Diagnosis not present

## 2023-06-25 DIAGNOSIS — G43809 Other migraine, not intractable, without status migrainosus: Secondary | ICD-10-CM | POA: Diagnosis not present

## 2023-06-25 DIAGNOSIS — N3946 Mixed incontinence: Secondary | ICD-10-CM | POA: Diagnosis not present

## 2023-06-25 DIAGNOSIS — B009 Herpesviral infection, unspecified: Secondary | ICD-10-CM | POA: Diagnosis not present

## 2023-06-25 DIAGNOSIS — Z13220 Encounter for screening for lipoid disorders: Secondary | ICD-10-CM | POA: Diagnosis not present

## 2023-10-15 ENCOUNTER — Ambulatory Visit: Payer: Self-pay | Admitting: Family Medicine

## 2023-10-15 NOTE — Telephone Encounter (Signed)
 This RN made second attempt to contact pt. Recording states, "Your call could not be completed as dialed."

## 2023-10-15 NOTE — Telephone Encounter (Signed)
 Attempt #1: This Rn attempted to call patient back after patient disconnected before warm transfer. Received message, "this call could not be completed as dialed." Unable to LVM. Will continue to attempt. Copied from CRM [id number]. Topic: Clinical - Red Word Triage >> Oct 15, 2023  4:57 PM Melissa C wrote: [facility] that prompted transfer to Nurse Triage: patient has an insurance that assigned her to a physician at [facility] that is not accepting new patients. Patient is really sick at the moment and wondering her options would be. Symptoms- severe vomitting, fatigue, weakness, chest tightness, body is achy, negative COVID, MASSIVE headache

## 2023-10-15 NOTE — Telephone Encounter (Signed)
  This RN made third attempt to contact pt. Recording states, "Your call could not be completed as dialed."

## 2023-11-20 ENCOUNTER — Ambulatory Visit: Admitting: Internal Medicine
